# Patient Record
Sex: Female | Born: 1980 | Race: White | Hispanic: No | Marital: Married | Smoking: Never smoker
Health system: Southern US, Community
[De-identification: ages and names within clinical notes are randomized; demographics above are authoritative.]

## PROBLEM LIST (undated history)

## (undated) ENCOUNTER — Inpatient Hospital Stay (HOSPITAL_COMMUNITY): Payer: Self-pay

## (undated) ENCOUNTER — Inpatient Hospital Stay (HOSPITAL_COMMUNITY): Payer: BC Managed Care – PPO

## (undated) DIAGNOSIS — Z8619 Personal history of other infectious and parasitic diseases: Secondary | ICD-10-CM

## (undated) DIAGNOSIS — O139 Gestational [pregnancy-induced] hypertension without significant proteinuria, unspecified trimester: Secondary | ICD-10-CM

## (undated) DIAGNOSIS — N200 Calculus of kidney: Secondary | ICD-10-CM

## (undated) DIAGNOSIS — Z789 Other specified health status: Secondary | ICD-10-CM

## (undated) HISTORY — DX: Personal history of other infectious and parasitic diseases: Z86.19

## (undated) HISTORY — PX: DILATION AND CURETTAGE OF UTERUS: SHX78

## (undated) HISTORY — DX: Calculus of kidney: N20.0

## (undated) HISTORY — DX: Gestational (pregnancy-induced) hypertension without significant proteinuria, unspecified trimester: O13.9

---

## 2000-02-06 HISTORY — PX: WISDOM TOOTH EXTRACTION: SHX21

## 2000-10-17 ENCOUNTER — Other Ambulatory Visit: Admission: RE | Admit: 2000-10-17 | Discharge: 2000-10-17 | Payer: Self-pay | Admitting: Obstetrics & Gynecology

## 2002-01-27 ENCOUNTER — Other Ambulatory Visit: Admission: RE | Admit: 2002-01-27 | Discharge: 2002-01-27 | Payer: Self-pay | Admitting: Obstetrics and Gynecology

## 2011-01-19 ENCOUNTER — Encounter (HOSPITAL_COMMUNITY): Payer: Self-pay | Admitting: Pharmacist

## 2011-01-19 NOTE — H&P (Addendum)
  H&P Dicatated #[id number] 01/19/11 DL Pt seen and examined.  Questions answered.  Informed consent obtained. 01/22/11 1000 DL

## 2011-01-21 MED ORDER — DEXTROSE 5 % IV SOLN
1.0000 g | INTRAVENOUS | Status: AC
  Administered 2011-01-22: 1 g via INTRAVENOUS
  Filled 2011-01-21: qty 1

## 2011-01-22 ENCOUNTER — Encounter (HOSPITAL_COMMUNITY): Payer: Self-pay | Admitting: *Deleted

## 2011-01-22 ENCOUNTER — Encounter (HOSPITAL_COMMUNITY): Payer: Self-pay | Admitting: Anesthesiology

## 2011-01-22 ENCOUNTER — Ambulatory Visit (HOSPITAL_COMMUNITY): Payer: BC Managed Care – PPO | Admitting: Anesthesiology

## 2011-01-22 ENCOUNTER — Other Ambulatory Visit: Payer: Self-pay | Admitting: Obstetrics and Gynecology

## 2011-01-22 ENCOUNTER — Ambulatory Visit (HOSPITAL_COMMUNITY)
Admission: RE | Admit: 2011-01-22 | Discharge: 2011-01-22 | Disposition: A | Discharge status: Home / Self Care | Payer: BC Managed Care – PPO | Source: Ambulatory Visit | Attending: Obstetrics and Gynecology | Admitting: Obstetrics and Gynecology

## 2011-01-22 ENCOUNTER — Encounter (HOSPITAL_COMMUNITY)
Admission: RE | Disposition: A | Discharge status: Home / Self Care | Payer: Self-pay | Source: Ambulatory Visit | Attending: Obstetrics and Gynecology

## 2011-01-22 DIAGNOSIS — O021 Missed abortion: Secondary | ICD-10-CM | POA: Insufficient documentation

## 2011-01-22 HISTORY — PX: DILATION AND EVACUATION: SHX1459

## 2011-01-22 HISTORY — DX: Other specified health status: Z78.9

## 2011-01-22 LAB — CBC
HCT: 34.1 % — ABNORMAL LOW (ref 36.0–46.0)
Hemoglobin: 11.9 g/dL — ABNORMAL LOW (ref 12.0–15.0)
MCV: 92.7 fL (ref 78.0–100.0)
RDW: 12.7 % (ref 11.5–15.5)
WBC: 6.3 10*3/uL (ref 4.0–10.5)

## 2011-01-22 LAB — TSH: TSH: 1.991 u[IU]/mL (ref 0.350–4.500)

## 2011-01-22 LAB — T4, FREE: Free T4: 1.01 ng/dL (ref 0.80–1.80)

## 2011-01-22 LAB — ANTITHROMBIN III: AntiThromb III Func: 85 % (ref 75–120)

## 2011-01-22 SURGERY — DILATION AND EVACUATION, UTERUS
Anesthesia: Monitor Anesthesia Care | Site: Uterus | Wound class: Clean Contaminated

## 2011-01-22 MED ORDER — DEXAMETHASONE SODIUM PHOSPHATE 10 MG/ML IJ SOLN
INTRAMUSCULAR | Status: DC | PRN
  Administered 2011-01-22: 10 mg via INTRAVENOUS

## 2011-01-22 MED ORDER — RHO D IMMUNE GLOBULIN 1500 UNIT/2ML IJ SOLN
300.0000 ug | Freq: Once | INTRAMUSCULAR | Status: AC
  Administered 2011-01-22: 300 ug via INTRAMUSCULAR

## 2011-01-22 MED ORDER — MIDAZOLAM HCL 2 MG/2ML IJ SOLN
INTRAMUSCULAR | Status: AC
  Filled 2011-01-22: qty 2

## 2011-01-22 MED ORDER — METHYLERGONOVINE MALEATE 0.2 MG PO TABS: 0.2000 mg | ORAL_TABLET | Freq: Three times a day (TID) | ORAL | Status: AC

## 2011-01-22 MED ORDER — ONDANSETRON HCL 4 MG/2ML IJ SOLN
INTRAMUSCULAR | Status: DC | PRN
  Administered 2011-01-22: 4 mg via INTRAVENOUS

## 2011-01-22 MED ORDER — METHYLERGONOVINE MALEATE 0.2 MG/ML IJ SOLN
INTRAMUSCULAR | Status: DC | PRN
  Administered 2011-01-22: 0.2 mg via INTRAMUSCULAR

## 2011-01-22 MED ORDER — CEFAZOLIN SODIUM 1-5 GM-% IV SOLN
INTRAVENOUS | Status: AC
  Filled 2011-01-22: qty 50

## 2011-01-22 MED ORDER — KETOROLAC TROMETHAMINE 30 MG/ML IJ SOLN: 15.0000 mg | Freq: Once | INTRAMUSCULAR | Status: DC | PRN

## 2011-01-22 MED ORDER — LACTATED RINGERS IV SOLN
INTRAVENOUS | Status: DC
  Administered 2011-01-22 (×2): via INTRAVENOUS

## 2011-01-22 MED ORDER — METHYLERGONOVINE MALEATE 0.2 MG/ML IJ SOLN
INTRAMUSCULAR | Status: AC
  Filled 2011-01-22: qty 1

## 2011-01-22 MED ORDER — FENTANYL CITRATE 0.05 MG/ML IJ SOLN
INTRAMUSCULAR | Status: AC
  Filled 2011-01-22: qty 2

## 2011-01-22 MED ORDER — OXYCODONE-ACETAMINOPHEN 5-325 MG PO TABS
ORAL_TABLET | ORAL | Status: AC
  Administered 2011-01-22: 1 via ORAL
  Filled 2011-01-22: qty 1

## 2011-01-22 MED ORDER — PROPOFOL 10 MG/ML IV EMUL
INTRAVENOUS | Status: DC | PRN
  Administered 2011-01-22: 30 mg via INTRAVENOUS
  Administered 2011-01-22 (×2): 20 mg via INTRAVENOUS
  Administered 2011-01-22: 4 mg via INTRAVENOUS
  Administered 2011-01-22 (×2): 20 mg via INTRAVENOUS
  Administered 2011-01-22: 30 mg via INTRAVENOUS
  Administered 2011-01-22 (×2): 20 mg via INTRAVENOUS

## 2011-01-22 MED ORDER — FENTANYL CITRATE 0.05 MG/ML IJ SOLN: 25.0000 ug | INTRAMUSCULAR | Status: DC | PRN

## 2011-01-22 MED ORDER — LIDOCAINE HCL (CARDIAC) 20 MG/ML IV SOLN
INTRAVENOUS | Status: AC
  Filled 2011-01-22: qty 5

## 2011-01-22 MED ORDER — KETOROLAC TROMETHAMINE 30 MG/ML IJ SOLN
INTRAMUSCULAR | Status: AC
  Filled 2011-01-22: qty 1

## 2011-01-22 MED ORDER — KETOROLAC TROMETHAMINE 30 MG/ML IJ SOLN
INTRAMUSCULAR | Status: DC | PRN
  Administered 2011-01-22: 30 mg via INTRAVENOUS

## 2011-01-22 MED ORDER — DEXAMETHASONE SODIUM PHOSPHATE 10 MG/ML IJ SOLN
INTRAMUSCULAR | Status: AC
  Filled 2011-01-22: qty 1

## 2011-01-22 MED ORDER — PROPOFOL 10 MG/ML IV EMUL
INTRAVENOUS | Status: AC
  Filled 2011-01-22: qty 20

## 2011-01-22 MED ORDER — MIDAZOLAM HCL 5 MG/5ML IJ SOLN
INTRAMUSCULAR | Status: DC | PRN
  Administered 2011-01-22: 2 mg via INTRAVENOUS

## 2011-01-22 MED ORDER — LIDOCAINE HCL (CARDIAC) 20 MG/ML IV SOLN
INTRAVENOUS | Status: DC | PRN
  Administered 2011-01-22: 60 mg via INTRAVENOUS

## 2011-01-22 MED ORDER — LIDOCAINE-EPINEPHRINE 1 %-1:100000 IJ SOLN
INTRAMUSCULAR | Status: DC | PRN
  Administered 2011-01-22: 20 mL

## 2011-01-22 MED ORDER — FENTANYL CITRATE 0.05 MG/ML IJ SOLN
INTRAMUSCULAR | Status: DC | PRN
  Administered 2011-01-22: 100 ug via INTRAVENOUS

## 2011-01-22 MED ORDER — OXYCODONE-ACETAMINOPHEN 5-325 MG PO TABS
1.0000 | ORAL_TABLET | Freq: Once | ORAL | Status: AC
  Administered 2011-01-22: 1 via ORAL

## 2011-01-22 MED ORDER — ONDANSETRON HCL 4 MG/2ML IJ SOLN
INTRAMUSCULAR | Status: AC
Start: 2011-01-22 — End: 2011-01-22
  Filled 2011-01-22: qty 2

## 2011-01-22 SURGICAL SUPPLY — 18 items
CATH ROBINSON RED A/P 16FR (CATHETERS) ×2 IMPLANT
CLOTH BEACON ORANGE TIMEOUT ST (SAFETY) ×2 IMPLANT
DECANTER SPIKE VIAL GLASS SM (MISCELLANEOUS) ×2 IMPLANT
GLOVE BIO SURGEON STRL SZ8 (GLOVE) ×2 IMPLANT
GLOVE SURG ORTHO 8.0 STRL STRW (GLOVE) ×2 IMPLANT
GOWN PREVENTION PLUS LG XLONG (DISPOSABLE) ×2 IMPLANT
KIT BERKELEY 1ST TRIMESTER 3/8 (MISCELLANEOUS) ×2 IMPLANT
NEEDLE SPNL 22GX3.5 QUINCKE BK (NEEDLE) ×2 IMPLANT
NS IRRIG 1000ML POUR BTL (IV SOLUTION) ×2 IMPLANT
PACK VAGINAL MINOR WOMEN LF (CUSTOM PROCEDURE TRAY) ×2 IMPLANT
PAD PREP 24X48 CUFFED NSTRL (MISCELLANEOUS) ×2 IMPLANT
SET BERKELEY SUCTION TUBING (SUCTIONS) ×2 IMPLANT
SYR CONTROL 10ML LL (SYRINGE) ×2 IMPLANT
TOWEL OR 17X24 6PK STRL BLUE (TOWEL DISPOSABLE) ×4 IMPLANT
VACURETTE 10 RIGID CVD (CANNULA) IMPLANT
VACURETTE 7MM CVD STRL WRAP (CANNULA) IMPLANT
VACURETTE 8 RIGID CVD (CANNULA) IMPLANT
VACURETTE 9 RIGID CVD (CANNULA) IMPLANT

## 2011-01-22 NOTE — Transfer of Care (Signed)
Immediate Anesthesia Transfer of Care Note  Patient: Lynn Barnett  Procedure(s) Performed:  DILATATION AND EVACUATION - with genetic studies  Patient Location: PACU  Anesthesia Type: MAC  Level of Consciousness: sedated  Airway & Oxygen Therapy: Patient Spontanous Breathing and Patient connected to nasal cannula oxygen  Post-op Assessment: Report given to PACU RN  Post vital signs: Reviewed and stable  Complications: No apparent anesthesia complications

## 2011-01-22 NOTE — Anesthesia Preprocedure Evaluation (Signed)
Anesthesia Evaluation  Patient identified by MRN, date of birth, ID band Patient awake    Reviewed: Allergy & Precautions, H&P , NPO status , Patient's Chart, lab work & pertinent test results, reviewed documented beta blocker date and time   History of Anesthesia Complications Negative for: history of anesthetic complications  Airway Mallampati: II      Dental  (+) Teeth Intact   Pulmonary Recent URI  (currently on z-pack.  cough with clear sputum, no fever),  clear to auscultation  Pulmonary exam normal       Cardiovascular neg cardio ROS regular Normal    Neuro/Psych Negative Neurological ROS  Negative Psych ROS   GI/Hepatic negative GI ROS, Neg liver ROS,   Endo/Other  Negative Endocrine ROS  Renal/GU negative Renal ROS     Musculoskeletal   Abdominal   Peds  Hematology negative hematology ROS (+)   Anesthesia Other Findings   Reproductive/Obstetrics (+) Pregnancy (missed ab)                           Anesthesia Physical Anesthesia Plan  ASA: II  Anesthesia Plan: MAC   Post-op Pain Management:    Induction:   Airway Management Planned:   Additional Equipment:   Intra-op Plan:   Post-operative Plan:   Informed Consent: I have reviewed the patients History and Physical, chart, labs and discussed the procedure including the risks, benefits and alternatives for the proposed anesthesia with the patient or authorized representative who has indicated his/her understanding and acceptance.   Dental Advisory Given  Plan Discussed with: CRNA and Surgeon  Anesthesia Plan Comments:         Anesthesia Quick Evaluation

## 2011-01-22 NOTE — H&P (Signed)
NAMEBROOKLYNE, RADKE                ACCOUNT NO.:  [id number]  MEDICAL RECORD NO.:  [id number]  LOCATION:                                 FACILITY:  PHYSICIAN:  Dineen Kid. Rana Snare, M.D.    DATE OF BIRTH:  1980/09/29  DATE OF ADMISSION:  01/22/2011 DATE OF DISCHARGE:                             HISTORY & PHYSICAL   She is a 30 year old, G2, P0, at 10 weeks 6 days gestational age, who presented to my office yesterday for new OB evaluation.  Has a history of a miscarriage 6 months ago.  Unable to hear fetal heart tones.  She underwent ultrasound evaluation showing an embryonic demise approximately 10 week size.  She presents for dilation and evacuation. She is Rh negative.  PAST MEDICAL HISTORY:  Negative.  PAST SURGICAL HISTORY:  Also, was negative.  MEDICATIONS:  Prenatal vitamins.  ALLERGIES:  She has no known drug allergies.  PHYSICAL EXAMINATION:  VITAL SIGNS:  Her blood pressure is 102/62. HEART:  Regular rate and rhythm. LUNGS:  Clear to auscultation bilaterally. ABDOMEN:  Nondistended and nontender.  Uterus 10 week size, anteverted, mobile.  IMPRESSION AND PLAN:  Embryonic demise at 10 weeks 6 days gestational age.  This is her second miscarriage.  We are going to do genetic evaluation of the products of conception and other habitual aborter labs.  Risks and benefits of D and E were discussed at length.  Informed consent was obtained.     Dineen Kid Rana Snare, M.D.     DCL/MEDQ  D:  01/19/2011  T:  01/20/2011  Job:  [id number]

## 2011-01-22 NOTE — Op Note (Signed)
[id number] Dictation D&E for 10week Embryonic demise MAC/para cx EBL Min Spec:  POC for genetics No complications

## 2011-01-22 NOTE — Anesthesia Postprocedure Evaluation (Addendum)
Anesthesia Post Note  Patient: Lynn Barnett  Procedure(s) Performed:  DILATATION AND EVACUATION - with genetic studies  Anesthesia type: MAC  Patient location: PACU  Post pain: Pain level controlled  Post assessment: Post-op Vital signs reviewed  Last Vitals:  Filed Vitals:   01/22/11 1140  BP:   Pulse:   Temp: 36.3 C  Resp:     Post vital signs: Reviewed  Level of consciousness: sedated  Complications: No apparent anesthesia complications

## 2011-01-23 ENCOUNTER — Encounter (HOSPITAL_COMMUNITY): Payer: Self-pay | Admitting: Obstetrics and Gynecology

## 2011-01-23 LAB — RH IG WORKUP (INCLUDES ABO/RH): Unit division: 0

## 2011-01-23 LAB — LUPUS ANTICOAGULANT PANEL
Lupus Anticoagulant: NOT DETECTED
PTT Lupus Anticoagulant: 37.8 secs (ref 28.0–43.0)

## 2011-01-23 LAB — BETA-2-GLYCOPROTEIN I ABS, IGG/M/A
Beta-2 Glyco I IgG: 0 G Units (ref <20)
Beta-2-Glycoprotein I IgM: 2 M Units (ref <20)

## 2011-01-23 LAB — ANA: Anti Nuclear Antibody(ANA): NEGATIVE

## 2011-01-23 LAB — CARDIOLIPIN ANTIBODIES, IGG, IGM, IGA: Anticardiolipin IgA: 3 APL U/mL — ABNORMAL LOW (ref <22)

## 2011-01-23 LAB — PROTEIN C ACTIVITY: Protein C Activity: 200 % — ABNORMAL HIGH (ref 75–133)

## 2011-01-23 NOTE — Op Note (Signed)
NAMETENIYA, FILTER                ACCOUNT NO.:  [id number]  MEDICAL RECORD NO.:  [id number]  LOCATION:  [facility]                          FACILITY:  [facility]  PHYSICIAN:  Dineen Kid. Rana Snare, M.D.    DATE OF BIRTH:  08-29-1980  DATE OF PROCEDURE:  01/22/2011 DATE OF DISCHARGE:  01/22/2011                              OPERATIVE REPORT   PREOPERATIVE DIAGNOSIS:  Embryonic demise at [redacted] weeks gestational age.  POSTOPERATIVE DIAGNOSIS:  Embryonic demise at [redacted] weeks gestational age.  PROCEDURE:  Dilation and evacuation.  SURGEON:  Dineen Kid. Rana Snare, MD  ANESTHESIA:  Monitored anesthetic care and paracervical block.  INDICATIONS:  Ms. Happel is a 30 year old G2, now P2 with a 18-week embryonic demise confirmed by recent ultrasound.  She desires the dilation and evacuation.  Risks and benefits were discussed.  Informed consent was obtained.  DESCRIPTION OF PROCEDURE:  After adequate analgesia, the patient was placed in the dorsal lithotomy position.  She was sterilely prepped and draped.  The bladder was sterilely drained.  Graves speculum was placed. Tenaculum was placed in the anterior lip of the cervix.  Paracervical block was placed with 1% Xylocaine 1:100,000 epinephrine.  Total of 20 mL was used.  The uterus sounded 12 cm, easily dilated to #35 Pratt dilator.  A 10-mm suction curette was inserted.  Suction curettage was carried out retrieving products of conception.  Curettage was carried out until gritty surface was felt throughout the endometrial cavity. The patient was then given Methergine 0.2 mg with good uterine response and minimal tissue being retrieved.  The curette was then removed. Tenaculum was removed from the cervix, noted to be hemostatic.  The patient was then transferred to the recovery room in stable condition. Sponge count was normal x3.  Estimated blood loss was minimal.  The patient received 1 g of cefotetan preoperatively, 0.2 mg of Methergine IM intraoperatively, and 30 mg  of Toradol IV postoperatively.  DISPOSITION:  The patient will be discharged home and will follow up in the office in 2-3 weeks.  Sent her with routine instruction for D and E. Told to return for increased pain, fever, or bleeding, also prescription for Methergine 0.2 mg to take 3 times a day for 2 days.  She also received RhoGAM for Rh-negative status.  The tissue was sent for genetics.  Preoperatively ordered labs done as well.    Dineen Kid Rana Snare, M.D.    DCL/MEDQ  D:  01/22/2011  T:  01/23/2011  Job:  [id number]

## 2011-01-24 LAB — PROTEIN C, TOTAL: Protein C, Total: 169 % — ABNORMAL HIGH (ref 72–160)

## 2011-02-05 LAB — TISSUE HYBRIDIZATION TO NCBH

## 2011-02-05 LAB — CHROMOSOME ANALYSIS, PERIPHERAL BLOOD

## 2011-02-05 LAB — CHROMOSOME STD, POC(TISSUE)-NCBH

## 2011-10-04 LAB — OB RESULTS CONSOLE ANTIBODY SCREEN: Antibody Screen: NEGATIVE

## 2011-10-04 LAB — OB RESULTS CONSOLE ABO/RH

## 2011-10-04 LAB — OB RESULTS CONSOLE HEPATITIS B SURFACE ANTIGEN: Hepatitis B Surface Ag: NEGATIVE

## 2012-02-06 NOTE — L&D Delivery Note (Signed)
Delivery Note  Pt pushing x 1.5 hours with Vtx ROP manually rotated to ROA.  Pt reports being exhausted.  We discussed R&B  Discussed and informed consent for VE.  ROA at +3.  Mighty vac placed and one pull. SVD viable female Apgars 9,9 over intact perineum but right labia and periurethral laceration.  Placenta delivered spontaneously intact with 3VC. Repair with 2-0 Chromic with good support and hemostasis noted and R/V exam confirms.  PH art was sent.  [place] cord blood was done.  Mother and baby were doing well.  EBL 400cc  Candice Camp, MD

## 2012-04-11 ENCOUNTER — Institutional Professional Consult (permissible substitution): Payer: BC Managed Care – PPO | Admitting: Pediatrics

## 2012-04-15 ENCOUNTER — Ambulatory Visit (INDEPENDENT_AMBULATORY_CARE_PROVIDER_SITE_OTHER): Payer: BC Managed Care – PPO | Admitting: Pediatrics

## 2012-04-15 DIAGNOSIS — Z7681 Expectant parent(s) prebirth pediatrician visit: Secondary | ICD-10-CM

## 2012-04-15 NOTE — Progress Notes (Signed)
Subjective:     Patient ID: Lynn Barnett, female   DOB: August 28, 1980, 32 y.o.   MRN: [id number]  HPI Review of Systems Objective:   Physical Exam  Described practice hours, after hours contact information and clinic Answered parent questions Recommended reviewing "Period of Purple Crying" and "Happiest Baby on the Block"

## 2012-05-01 ENCOUNTER — Encounter (HOSPITAL_COMMUNITY): Payer: Self-pay | Admitting: *Deleted

## 2012-05-01 ENCOUNTER — Telehealth (HOSPITAL_COMMUNITY): Payer: Self-pay | Admitting: *Deleted

## 2012-05-01 NOTE — Telephone Encounter (Signed)
Preadmission screen  

## 2012-05-03 ENCOUNTER — Inpatient Hospital Stay (HOSPITAL_COMMUNITY)
Admission: AD | Admit: 2012-05-03 | Discharge: 2012-05-03 | Disposition: A | Discharge status: Home / Self Care | Payer: BC Managed Care – PPO | Source: Ambulatory Visit | Attending: Obstetrics and Gynecology | Admitting: Obstetrics and Gynecology

## 2012-05-03 ENCOUNTER — Encounter (HOSPITAL_COMMUNITY): Payer: Self-pay | Admitting: Family

## 2012-05-03 DIAGNOSIS — O99891 Other specified diseases and conditions complicating pregnancy: Secondary | ICD-10-CM | POA: Insufficient documentation

## 2012-05-03 NOTE — MAU Note (Signed)
Pt reports she had some uncontrolled leaking his morning while she was in bed. Not leaking any more now. Reports good fetal movment denies bleeding and having mild occasional ctx.

## 2012-05-03 NOTE — MAU Note (Signed)
Patient presents to MAU with c/o leaking clear fluid with mucous today at 0830. Denies vaginal bleeding, cramping. Reports good fetal movement.  Patient reports that she was 2-2.5 cm in office on Wednesday.

## 2012-05-04 ENCOUNTER — Encounter (HOSPITAL_COMMUNITY): Payer: Self-pay | Admitting: Obstetrics and Gynecology

## 2012-05-04 ENCOUNTER — Inpatient Hospital Stay (HOSPITAL_COMMUNITY)
Admission: AD | Admit: 2012-05-04 | Discharge: 2012-05-04 | Disposition: A | Discharge status: Home / Self Care | Payer: BC Managed Care – PPO | Source: Ambulatory Visit | Attending: Obstetrics and Gynecology | Admitting: Obstetrics and Gynecology

## 2012-05-04 DIAGNOSIS — O479 False labor, unspecified: Secondary | ICD-10-CM | POA: Insufficient documentation

## 2012-05-04 NOTE — MAU Note (Signed)
Patient states she is having contractions every 3-6 minutes with bloody show. States she has not felt fetal movement this am. Was seen in MAU about 24 hours ago and was 3 cm.

## 2012-05-04 NOTE — Progress Notes (Signed)
Notified Dr. Marcelle Overlie of cervical exam and patients appt. To have induction tomorrow at 0615. Pt notified to come back if things become stronger.

## 2012-05-05 ENCOUNTER — Encounter (HOSPITAL_COMMUNITY): Payer: Self-pay | Admitting: Anesthesiology

## 2012-05-05 ENCOUNTER — Inpatient Hospital Stay (HOSPITAL_COMMUNITY): Payer: BC Managed Care – PPO | Admitting: Anesthesiology

## 2012-05-05 ENCOUNTER — Encounter (HOSPITAL_COMMUNITY): Payer: Self-pay

## 2012-05-05 ENCOUNTER — Inpatient Hospital Stay (HOSPITAL_COMMUNITY)
Admission: RE | Admit: 2012-05-05 | Discharge: 2012-05-07 | DRG: 373 | Disposition: A | Discharge status: Home / Self Care | Payer: BC Managed Care – PPO | Source: Ambulatory Visit | Attending: Obstetrics and Gynecology | Admitting: Obstetrics and Gynecology

## 2012-05-05 LAB — CBC
Hemoglobin: 11.6 g/dL — ABNORMAL LOW (ref 12.0–15.0)
MCHC: 33.7 g/dL (ref 30.0–36.0)
Platelets: 174 10*3/uL (ref 150–400)
RDW: 14.3 % (ref 11.5–15.5)

## 2012-05-05 LAB — RPR: RPR Ser Ql: NONREACTIVE

## 2012-05-05 MED ORDER — OXYCODONE-ACETAMINOPHEN 5-325 MG PO TABS: 1.0000 | ORAL_TABLET | ORAL | Status: DC | PRN

## 2012-05-05 MED ORDER — EPHEDRINE 5 MG/ML INJ
10.0000 mg | INTRAVENOUS | Status: DC | PRN
  Filled 2012-05-05: qty 4
  Filled 2012-05-05: qty 2

## 2012-05-05 MED ORDER — LIDOCAINE HCL (PF) 1 % IJ SOLN
INTRAMUSCULAR | Status: DC | PRN
  Administered 2012-05-05 (×2): 5 mL

## 2012-05-05 MED ORDER — TERBUTALINE SULFATE 1 MG/ML IJ SOLN: 0.2500 mg | Freq: Once | INTRAMUSCULAR | Status: AC | PRN

## 2012-05-05 MED ORDER — PHENYLEPHRINE 40 MCG/ML (10ML) SYRINGE FOR IV PUSH (FOR BLOOD PRESSURE SUPPORT)
80.0000 ug | PREFILLED_SYRINGE | INTRAVENOUS | Status: DC | PRN
  Filled 2012-05-05: qty 2

## 2012-05-05 MED ORDER — PHENYLEPHRINE 40 MCG/ML (10ML) SYRINGE FOR IV PUSH (FOR BLOOD PRESSURE SUPPORT)
80.0000 ug | PREFILLED_SYRINGE | INTRAVENOUS | Status: DC | PRN
  Filled 2012-05-05: qty 2
  Filled 2012-05-05: qty 5

## 2012-05-05 MED ORDER — EPHEDRINE 5 MG/ML INJ
10.0000 mg | INTRAVENOUS | Status: DC | PRN
  Filled 2012-05-05: qty 2

## 2012-05-05 MED ORDER — OXYTOCIN 40 UNITS IN LACTATED RINGERS INFUSION - SIMPLE MED
62.5000 mL/h | INTRAVENOUS | Status: DC
  Administered 2012-05-05: 2 m[IU]/min via INTRAVENOUS
  Administered 2012-05-05: 4 m[IU]/min via INTRAVENOUS
  Filled 2012-05-05: qty 1000

## 2012-05-05 MED ORDER — OXYTOCIN BOLUS FROM INFUSION
500.0000 mL | INTRAVENOUS | Status: DC
  Administered 2012-05-05: 500 mL via INTRAVENOUS

## 2012-05-05 MED ORDER — OXYTOCIN 40 UNITS IN LACTATED RINGERS INFUSION - SIMPLE MED: 62.5000 mL/h | INTRAVENOUS | Status: DC

## 2012-05-05 MED ORDER — DIPHENHYDRAMINE HCL 50 MG/ML IJ SOLN: 12.5000 mg | INTRAMUSCULAR | Status: DC | PRN

## 2012-05-05 MED ORDER — LACTATED RINGERS IV SOLN: 500.0000 mL | Freq: Once | INTRAVENOUS | Status: DC

## 2012-05-05 MED ORDER — PRENATAL MULTIVITAMIN CH: 1.0000 | ORAL_TABLET | Freq: Every day | ORAL | Status: DC

## 2012-05-05 MED ORDER — FLEET ENEMA 7-19 GM/118ML RE ENEM: 1.0000 | ENEMA | Freq: Every day | RECTAL | Status: DC | PRN

## 2012-05-05 MED ORDER — LIDOCAINE HCL (PF) 1 % IJ SOLN
30.0000 mL | INTRAMUSCULAR | Status: DC | PRN
  Administered 2012-05-05: 30 mL via SUBCUTANEOUS
  Filled 2012-05-05 (×2): qty 30

## 2012-05-05 MED ORDER — LACTATED RINGERS IV SOLN
INTRAVENOUS | Status: DC
  Administered 2012-05-05: 21:00:00 via INTRAVENOUS
  Administered 2012-05-05 (×2): 1000 mL via INTRAVENOUS

## 2012-05-05 MED ORDER — IBUPROFEN 600 MG PO TABS: 600.0000 mg | ORAL_TABLET | Freq: Four times a day (QID) | ORAL | Status: DC | PRN

## 2012-05-05 MED ORDER — CITRIC ACID-SODIUM CITRATE 334-500 MG/5ML PO SOLN: 30.0000 mL | ORAL | Status: DC | PRN

## 2012-05-05 MED ORDER — ACETAMINOPHEN 325 MG PO TABS: 650.0000 mg | ORAL_TABLET | ORAL | Status: DC | PRN

## 2012-05-05 MED ORDER — FENTANYL 2.5 MCG/ML BUPIVACAINE 1/10 % EPIDURAL INFUSION (WH - ANES)
14.0000 mL/h | INTRAMUSCULAR | Status: DC | PRN
  Administered 2012-05-05 (×2): 14 mL/h via EPIDURAL
  Filled 2012-05-05 (×2): qty 125

## 2012-05-05 MED ORDER — ONDANSETRON HCL 4 MG/2ML IJ SOLN: 4.0000 mg | Freq: Four times a day (QID) | INTRAMUSCULAR | Status: DC | PRN

## 2012-05-05 MED ORDER — LACTATED RINGERS IV SOLN
500.0000 mL | INTRAVENOUS | Status: DC | PRN
  Administered 2012-05-05: 1000 mL via INTRAVENOUS
  Administered 2012-05-05: 500 mL via INTRAVENOUS

## 2012-05-05 NOTE — Anesthesia Procedure Notes (Signed)
Epidural Patient location during procedure: OB Start time: 05/05/2012 11:13 AM  Staffing Anesthesiologist: Angus Seller., Harrell Gave. Performed by: anesthesiologist   Preanesthetic Checklist Completed: patient identified, site marked, surgical consent, pre-op evaluation, timeout performed, IV checked, risks and benefits discussed and monitors and equipment checked  Epidural Patient position: sitting Prep: site prepped and draped and DuraPrep Patient monitoring: continuous pulse ox and blood pressure Approach: midline Injection technique: LOR air and LOR saline  Needle:  Needle type: Tuohy  Needle gauge: 17 G Needle length: 9 cm and 9 Needle insertion depth: 5 cm cm Catheter type: closed end flexible Catheter size: 19 Gauge Catheter at skin depth: 10 cm Test dose: negative  Assessment Events: blood not aspirated, injection not painful, no injection resistance, negative IV test and no paresthesia  Additional Notes Patient identified.  Risk benefits discussed including failed block, incomplete pain control, headache, nerve damage, paralysis, blood pressure changes, nausea, vomiting, reactions to medication both toxic or allergic, and postpartum back pain.  Patient expressed understanding and wished to proceed.  All questions were answered.  Sterile technique used throughout procedure and epidural site dressed with sterile barrier dressing. No paresthesia or other complications noted.The patient did not experience any signs of intravascular injection such as tinnitus or metallic taste in mouth nor signs of intrathecal spread such as rapid motor block. Please see nursing notes for vital signs.

## 2012-05-05 NOTE — Progress Notes (Signed)
Dr Rana Snare updated on SVE, FHR, UC pattern, and nursing interventions.  No new orders given.

## 2012-05-05 NOTE — Anesthesia Preprocedure Evaluation (Signed)

## 2012-05-05 NOTE — Progress Notes (Signed)
Dr Rana Snare updated on SVE, FHR, and UC pattern.  Orders given to start pushing and call for delivery.

## 2012-05-05 NOTE — H&P (Signed)
Lynn Barnett is a 32 y.o. female presenting for IOL due to favorable cervix and pts desire for induction.  Also had presented this weekend for labor check and has had prolonged latent labor sxs.  Pregnancy uncomplicated.Marland Kitchen History OB History   Grav Para Term Preterm Abortions TAB SAB Ect Mult Living   3 0   2  2        Past Medical History  Diagnosis Date  . No pertinent past medical history   . Hx of varicella    Past Surgical History  Procedure Laterality Date  . Wisdom tooth extraction  2002  . Dilation and evacuation  01/22/2011    Procedure: DILATATION AND EVACUATION;  Surgeon: Turner Daniels, MD;  Location: [facility];  Service: Gynecology;  Laterality: N/A;  with genetic studies  . Dilation and curettage of uterus     Family History: family history includes Diabetes in her cousin; Heart attack in her maternal grandfather; Hypertension in her father and paternal grandmother; Mental illness in her cousin; and Thyroid disease in her cousin. Social History:  reports that she has never smoked. She has never used smokeless tobacco. She reports that she drinks about 0.6 ounces of alcohol per week. She reports that she does not use illicit drugs.   Prenatal Transfer Tool  Maternal Diabetes: No Genetic Screening: Normal Maternal Ultrasounds/Referrals: Normal Fetal Ultrasounds or other Referrals:  None Maternal Substance Abuse:  No Significant Maternal Medications:  None Significant Maternal Lab Results:  None Other Comments:  None  ROS  Dilation: 4 Effacement (%): 90 Station: -2 Exam by:: Dr Rana Snare AROM clear fluid Blood pressure 121/72, pulse 79, temperature 98.2 F (36.8 C), temperature source Oral, resp. rate 18, last menstrual period 07/24/2011. Exam Physical Exam  Prenatal labs: ABO, Rh: B/Negative/-- (08/29 0000) Antibody: Negative (08/29 0000) Rubella: Immune (08/29 0000) RPR: Nonreactive (08/29 0000)  HBsAg: Negative (08/29 0000)  HIV: Non-reactive (08/29 0000)  GBS:  Negative (03/03 0000)   Assessment/Plan: IUP at term with favorable cervix and desires induction AROM and anticipate SVD   Ronnesha Mester C 05/05/2012, 9:05 AM

## 2012-05-06 ENCOUNTER — Encounter (HOSPITAL_COMMUNITY): Payer: Self-pay

## 2012-05-06 LAB — CBC
Hemoglobin: 9.6 g/dL — ABNORMAL LOW (ref 12.0–15.0)
MCHC: 34.5 g/dL (ref 30.0–36.0)
Platelets: 135 10*3/uL — ABNORMAL LOW (ref 150–400)
RBC: 2.97 MIL/uL — ABNORMAL LOW (ref 3.87–5.11)

## 2012-05-06 MED ORDER — PRENATAL MULTIVITAMIN CH
1.0000 | ORAL_TABLET | Freq: Every day | ORAL | Status: DC
  Administered 2012-05-06 – 2012-05-07 (×2): 1 via ORAL
  Filled 2012-05-06 (×2): qty 1

## 2012-05-06 MED ORDER — OXYCODONE-ACETAMINOPHEN 5-325 MG PO TABS: 1.0000 | ORAL_TABLET | ORAL | Status: DC | PRN

## 2012-05-06 MED ORDER — MEASLES, MUMPS & RUBELLA VAC ~~LOC~~ INJ
0.5000 mL | INJECTION | Freq: Once | SUBCUTANEOUS | Status: DC
  Filled 2012-05-06: qty 0.5

## 2012-05-06 MED ORDER — SENNOSIDES-DOCUSATE SODIUM 8.6-50 MG PO TABS
2.0000 | ORAL_TABLET | Freq: Every day | ORAL | Status: DC
  Administered 2012-05-06: 2 via ORAL

## 2012-05-06 MED ORDER — MEDROXYPROGESTERONE ACETATE 150 MG/ML IM SUSP: 150.0000 mg | INTRAMUSCULAR | Status: DC | PRN

## 2012-05-06 MED ORDER — DIBUCAINE 1 % RE OINT: 1.0000 "application " | TOPICAL_OINTMENT | RECTAL | Status: DC | PRN

## 2012-05-06 MED ORDER — IBUPROFEN 600 MG PO TABS
600.0000 mg | ORAL_TABLET | Freq: Four times a day (QID) | ORAL | Status: DC
  Administered 2012-05-06 – 2012-05-07 (×6): 600 mg via ORAL
  Filled 2012-05-06 (×6): qty 1

## 2012-05-06 MED ORDER — LANOLIN HYDROUS EX OINT: TOPICAL_OINTMENT | CUTANEOUS | Status: DC | PRN

## 2012-05-06 MED ORDER — DIPHENHYDRAMINE HCL 25 MG PO CAPS: 25.0000 mg | ORAL_CAPSULE | Freq: Four times a day (QID) | ORAL | Status: DC | PRN

## 2012-05-06 MED ORDER — WITCH HAZEL-GLYCERIN EX PADS: 1.0000 "application " | MEDICATED_PAD | CUTANEOUS | Status: DC | PRN

## 2012-05-06 MED ORDER — SIMETHICONE 80 MG PO CHEW: 80.0000 mg | CHEWABLE_TABLET | ORAL | Status: DC | PRN

## 2012-05-06 MED ORDER — ONDANSETRON HCL 4 MG PO TABS: 4.0000 mg | ORAL_TABLET | ORAL | Status: DC | PRN

## 2012-05-06 MED ORDER — ZOLPIDEM TARTRATE 5 MG PO TABS: 5.0000 mg | ORAL_TABLET | Freq: Every evening | ORAL | Status: DC | PRN

## 2012-05-06 MED ORDER — TETANUS-DIPHTH-ACELL PERTUSSIS 5-2.5-18.5 LF-MCG/0.5 IM SUSP: 0.5000 mL | Freq: Once | INTRAMUSCULAR | Status: DC

## 2012-05-06 MED ORDER — ONDANSETRON HCL 4 MG/2ML IJ SOLN: 4.0000 mg | INTRAMUSCULAR | Status: DC | PRN

## 2012-05-06 MED ORDER — BENZOCAINE-MENTHOL 20-0.5 % EX AERO
1.0000 "application " | INHALATION_SPRAY | CUTANEOUS | Status: DC | PRN
  Administered 2012-05-06 – 2012-05-07 (×2): 1 via TOPICAL
  Filled 2012-05-06 (×2): qty 56

## 2012-05-06 NOTE — Progress Notes (Signed)
Dr Rana Snare explained to pt risks and benefits of using vacuum for delivery.  Pt verbalized understanding and gave verbal consent for procedure.

## 2012-05-06 NOTE — Progress Notes (Signed)
Post Partum Day 1 Subjective: no complaints, up ad lib, voiding and tolerating PO  Objective: Blood pressure 106/65, pulse 72, temperature 97.9 F (36.6 C), temperature source Oral, resp. rate 18, height 5\' 4"  (1.626 m), weight 164 lb (74.39 kg), last menstrual period 07/24/2011, SpO2 94.00%, unknown if currently breastfeeding.  Physical Exam:  General: alert and cooperative Lochia: appropriate Uterine Fundus: firm Incision: perineum intact, small labial edema noted DVT Evaluation: No evidence of DVT seen on physical exam. Negative Homan's sign. No cords or calf tenderness. No significant calf/ankle edema.   Recent Labs  05/05/12 0707 05/06/12 0630  HGB 11.6* 9.6*  HCT 34.4* 27.8*    Assessment/Plan: Plan for discharge tomorrow and Circumcision prior to discharge   LOS: 1 day   Lynn Barnett G 05/06/2012, 7:53 AM

## 2012-05-06 NOTE — Anesthesia Postprocedure Evaluation (Signed)
  Anesthesia Post-op Note  Patient: Lynn Barnett  Procedure(s) Performed: * No procedures listed *  Patient Location: Mother/Baby  Anesthesia Type:Epidural  Level of Consciousness: awake, alert , oriented and patient cooperative  Airway and Oxygen Therapy: Patient Spontanous Breathing  Post-op Pain: mild  Post-op Assessment: Patient's Cardiovascular Status Stable, Respiratory Function Stable, Patent Airway, No signs of Nausea or vomiting, Adequate PO intake and Pain level controlled  Post-op Vital Signs: Reviewed and stable  Complications: No apparent anesthesia complications

## 2012-05-07 LAB — RH IG WORKUP (INCLUDES ABO/RH)
ABO/RH(D): B NEG
Gestational Age(Wks): 39.6

## 2012-05-07 MED ORDER — IBUPROFEN 600 MG PO TABS: 600.0000 mg | ORAL_TABLET | Freq: Four times a day (QID) | ORAL | Status: DC

## 2012-05-07 NOTE — Discharge Summary (Signed)
Obstetric Discharge Summary Reason for Admission: induction of labor Prenatal Procedures: ultrasound Intrapartum Procedures: vacuum Postpartum Procedures: none Complications-Operative and Postpartum: 2 degree perineal laceration Hemoglobin  Date Value Range Status  05/06/2012 9.6* 12.0 - 15.0 g/dL Final     DELTA CHECK NOTED     REPEATED TO VERIFY     HCT  Date Value Range Status  05/06/2012 27.8* 36.0 - 46.0 % Final    Physical Exam:  General: alert and cooperative Lochia: appropriate Uterine Fundus: firm Incision: perineum intact DVT Evaluation: No evidence of DVT seen on physical exam. Negative Homan's sign. No cords or calf tenderness. No significant calf/ankle edema.  Discharge Diagnoses: Term Pregnancy-delivered  Discharge Information: Date: 05/07/2012 Activity: pelvic rest Diet: routine Medications: PNV and Ibuprofen Condition: stable Instructions: refer to practice specific booklet Discharge to: home   Newborn Data: Live born female  Birth Weight: 8 lb 3.9 oz (3740 g) APGAR: 9, 9  Home with mother.  Abygail Galeno G 05/07/2012, 8:27 AM

## 2013-12-07 ENCOUNTER — Encounter (HOSPITAL_COMMUNITY): Payer: Self-pay

## 2015-07-18 LAB — OB RESULTS CONSOLE RPR: RPR: NONREACTIVE

## 2015-07-18 LAB — OB RESULTS CONSOLE HIV ANTIBODY (ROUTINE TESTING): HIV: NONREACTIVE

## 2015-07-18 LAB — OB RESULTS CONSOLE HEPATITIS B SURFACE ANTIGEN: Hepatitis B Surface Ag: NEGATIVE

## 2015-07-18 LAB — OB RESULTS CONSOLE RUBELLA ANTIBODY, IGM: Rubella: IMMUNE

## 2015-08-02 LAB — OB RESULTS CONSOLE GC/CHLAMYDIA
CHLAMYDIA, DNA PROBE: NEGATIVE
Gonorrhea: NEGATIVE

## 2016-01-23 LAB — OB RESULTS CONSOLE GBS: STREP GROUP B AG: NEGATIVE

## 2016-02-06 NOTE — L&D Delivery Note (Signed)
Operative Delivery Note At 6:09 AM a viable female was delivered via Vaginal, Vacuum Investment banker, operational(Extractor).  Presentation: vertex; Position: Right,, Occiput,, Transverse; Station: +3.  Verbal consent: obtained from patient.  Risks and benefits discussed in detail.  Risks include, but are not limited to the risks of anesthesia, bleeding, infection, damage to maternal tissues, fetal cephalhematoma.  There is also the risk of inability to effect vaginal delivery of the head, or shoulder dystocia that cannot be resolved by established maneuvers, leading to the need for emergency cesarean section.  APGAR: 7, 9; weight  .   Placenta status: , .   Cord:  with the following complications: nuchal cord x 1.  Cord pH: not obtained  Anesthesia:  epidural Instruments: mushroom Episiotomy: None Lacerations: Labial Suture Repair: 3.0 chromic Est. Blood Loss (mL): 300  Mom to postpartum.  Baby to Couplet care / Skin to Skin.  Lynn Barnett L 02/13/2016, 6:23 AM

## 2016-02-12 ENCOUNTER — Inpatient Hospital Stay (HOSPITAL_COMMUNITY)
Admission: AD | Admit: 2016-02-12 | Discharge: 2016-02-14 | DRG: 775 | Disposition: A | Discharge status: Home / Self Care | Payer: BC Managed Care – PPO | Source: Ambulatory Visit | Attending: Obstetrics and Gynecology | Admitting: Obstetrics and Gynecology

## 2016-02-12 ENCOUNTER — Encounter (HOSPITAL_COMMUNITY): Payer: Self-pay

## 2016-02-12 DIAGNOSIS — Z3A39 39 weeks gestation of pregnancy: Secondary | ICD-10-CM | POA: Diagnosis not present

## 2016-02-12 DIAGNOSIS — Z3493 Encounter for supervision of normal pregnancy, unspecified, third trimester: Secondary | ICD-10-CM | POA: Diagnosis present

## 2016-02-12 DIAGNOSIS — O4292 Full-term premature rupture of membranes, unspecified as to length of time between rupture and onset of labor: Secondary | ICD-10-CM

## 2016-02-12 DIAGNOSIS — Z8249 Family history of ischemic heart disease and other diseases of the circulatory system: Secondary | ICD-10-CM

## 2016-02-12 DIAGNOSIS — Z833 Family history of diabetes mellitus: Secondary | ICD-10-CM

## 2016-02-12 LAB — CBC
HEMATOCRIT: 33.4 % — AB (ref 36.0–46.0)
HEMOGLOBIN: 11.3 g/dL — AB (ref 12.0–15.0)
MCH: 31.5 pg (ref 26.0–34.0)
MCHC: 33.8 g/dL (ref 30.0–36.0)
MCV: 93 fL (ref 78.0–100.0)
Platelets: 161 10*3/uL (ref 150–400)
RBC: 3.59 MIL/uL — AB (ref 3.87–5.11)
RDW: 15.5 % (ref 11.5–15.5)
WBC: 6.1 10*3/uL (ref 4.0–10.5)

## 2016-02-12 LAB — POCT FERN TEST: POCT Fern Test: NEGATIVE

## 2016-02-12 MED ORDER — ACETAMINOPHEN 325 MG PO TABS: 650.0000 mg | ORAL_TABLET | ORAL | Status: DC | PRN

## 2016-02-12 MED ORDER — ONDANSETRON HCL 4 MG/2ML IJ SOLN: 4.0000 mg | Freq: Four times a day (QID) | INTRAMUSCULAR | Status: DC | PRN

## 2016-02-12 MED ORDER — OXYTOCIN BOLUS FROM INFUSION
500.0000 mL | Freq: Once | INTRAVENOUS | Status: AC
  Administered 2016-02-13: 500 mL via INTRAVENOUS

## 2016-02-12 MED ORDER — LACTATED RINGERS IV SOLN
INTRAVENOUS | Status: DC
  Administered 2016-02-12 – 2016-02-13 (×2): via INTRAVENOUS

## 2016-02-12 MED ORDER — SOD CITRATE-CITRIC ACID 500-334 MG/5ML PO SOLN: 30.0000 mL | ORAL | Status: DC | PRN

## 2016-02-12 MED ORDER — LIDOCAINE HCL (PF) 1 % IJ SOLN
30.0000 mL | INTRAMUSCULAR | Status: DC | PRN
Start: 2016-02-12 — End: 2016-02-13
  Filled 2016-02-12 (×2): qty 30

## 2016-02-12 MED ORDER — FLEET ENEMA 7-19 GM/118ML RE ENEM: 1.0000 | ENEMA | RECTAL | Status: DC | PRN

## 2016-02-12 MED ORDER — OXYCODONE-ACETAMINOPHEN 5-325 MG PO TABS: 1.0000 | ORAL_TABLET | ORAL | Status: DC | PRN

## 2016-02-12 MED ORDER — OXYTOCIN 40 UNITS IN LACTATED RINGERS INFUSION - SIMPLE MED
2.5000 [IU]/h | INTRAVENOUS | Status: DC
  Administered 2016-02-13: 2.5 [IU]/h via INTRAVENOUS
  Filled 2016-02-12: qty 1000

## 2016-02-12 MED ORDER — OXYCODONE-ACETAMINOPHEN 5-325 MG PO TABS: 2.0000 | ORAL_TABLET | ORAL | Status: DC | PRN

## 2016-02-12 MED ORDER — LACTATED RINGERS IV SOLN: 500.0000 mL | INTRAVENOUS | Status: DC | PRN

## 2016-02-12 NOTE — MAU Note (Signed)
Pt reports ? LOF at 9pm-clear. Some contractions every 5 mins. Denies vag bleeding. +FM.

## 2016-02-12 NOTE — MAU Provider Note (Signed)
History   [id number]   Chief Complaint  Patient presents with  . Rupture of Membranes    HPI Lynn Barnett is a 36 y.o. female  [phone] here with report of watery vaginal discharge that began at approximately 2100.  Leaking of fluid has continued.  Pt reports contractions and denies vaginal bleeding.   +fetal movement.   All other systems negative.    No LMP recorded. Patient is pregnant.  OB History  Gravida Para Term Preterm AB Living  4 1 1   2 1   SAB TAB Ectopic Multiple Live Births  2       1    # Outcome Date GA Lbr Len/2nd Weight Sex Delivery Anes PTL Lv  4 Current           3 Term 05/05/12 [redacted]w[redacted]d 44:36 / 02:07 8 lb 3.9 oz (3.74 kg) M Vag-Vacuum EPI  LIV     Birth Comments: CAH--Normal GAL--Normal Thyroid-Normal Biotinidase- Normal Hemoglobin--Normal, FA Amino Acid Profile-Normal Acylcarnitine Profile-Normal   2 SAB 2012          1 SAB 2012              Past Medical History:  Diagnosis Date  . Hx of varicella   . No pertinent past medical history     Family History  Problem Relation Age of Onset  . Hypertension Father   . Heart attack Maternal Grandfather   . Hypertension Paternal Grandmother   . Diabetes Cousin   . Mental illness Cousin     bipolar  . Thyroid disease Cousin     Social History   Social History  . Marital status: Married    Spouse name: N/A  . Number of children: N/A  . Years of education: N/A   Social History Main Topics  . Smoking status: Never Smoker  . Smokeless tobacco: Never Used  . Alcohol use 0.6 oz/week    1 Glasses of wine per week  . Drug use: No  . Sexual activity: Yes   Other Topics Concern  . Not on file   Social History Narrative  . No narrative on file    No Known Allergies  No current facility-administered medications on file prior to encounter.    Current Outpatient Prescriptions on File Prior to Encounter  Medication Sig Dispense Refill  . ibuprofen (ADVIL,MOTRIN) 600 MG tablet Take 1 tablet  (600 mg total) by mouth every 6 (six) hours. 30 tablet 1  . Prenatal Vit-Fe Fumarate-FA (PRENATAL MULTIVITAMIN) TABS Take 1 tablet by mouth daily at 12 noon.       Review of Systems  Genitourinary: Positive for pelvic pain (contractions) and vaginal discharge.     Physical Exam   Vitals:   02/12/16 2205  Pulse: 79  Resp: 16  Temp: 98.1 F (36.7 C)  TempSrc: Oral  SpO2: 99%    Physical Exam  Constitutional: She is oriented to person, place, and time. She appears well-developed and well-nourished. No distress.  HENT:  Head: Normocephalic.  Neck: Normal range of motion. Neck supple.  Cardiovascular: Normal rate, regular rhythm and normal heart sounds.   Respiratory: Effort normal and breath sounds normal.  GI: Soft. There is no tenderness.  Genitourinary: No bleeding in the vagina. Vaginal discharge found.  Genitourinary Comments: +pooling  Neurological: She is alert and oriented to person, place, and time.  Skin: Skin is warm and dry.  FHR 140, +accels Toco - 2-5   Dilation: 4.5 Effacement (%): 80  Cervical Position: Middle Exam by:: Margarita MailW. Karim, CNM   MAU Course  Procedures  MDM Results for orders placed or performed during the hospital encounter of 02/12/16 (from the past 24 hour(s))  Fern Test     Status: None   Collection Time: 02/12/16 10:50 PM  Result Value Ref Range   POCT Fern Test Negative = intact amniotic membranes    Amnisure pending  Assessment and Plan  35 y.o. [id number] at 2851w1d IUP R/O Rupture of Membranes Reactive NST  Plan: Amnisure pending Care resumed by Dr. Catha GosselinGrewal  Tc [place] N Karim, CNM 02/12/2016 10:49 PM

## 2016-02-13 ENCOUNTER — Inpatient Hospital Stay (HOSPITAL_COMMUNITY): Payer: BC Managed Care – PPO | Admitting: Anesthesiology

## 2016-02-13 ENCOUNTER — Encounter (HOSPITAL_COMMUNITY): Payer: Self-pay | Admitting: *Deleted

## 2016-02-13 LAB — RPR: RPR: NONREACTIVE

## 2016-02-13 MED ORDER — SIMETHICONE 80 MG PO CHEW: 80.0000 mg | CHEWABLE_TABLET | ORAL | Status: DC | PRN

## 2016-02-13 MED ORDER — BISACODYL 10 MG RE SUPP: 10.0000 mg | Freq: Every day | RECTAL | Status: DC | PRN

## 2016-02-13 MED ORDER — MEASLES, MUMPS & RUBELLA VAC ~~LOC~~ INJ
0.5000 mL | INJECTION | Freq: Once | SUBCUTANEOUS | Status: DC
  Filled 2016-02-13: qty 0.5

## 2016-02-13 MED ORDER — BENZOCAINE-MENTHOL 20-0.5 % EX AERO
1.0000 "application " | INHALATION_SPRAY | CUTANEOUS | Status: DC | PRN
  Filled 2016-02-13: qty 56

## 2016-02-13 MED ORDER — FLEET ENEMA 7-19 GM/118ML RE ENEM: 1.0000 | ENEMA | Freq: Every day | RECTAL | Status: DC | PRN

## 2016-02-13 MED ORDER — PHENYLEPHRINE 40 MCG/ML (10ML) SYRINGE FOR IV PUSH (FOR BLOOD PRESSURE SUPPORT)
PREFILLED_SYRINGE | INTRAVENOUS | Status: AC
  Filled 2016-02-13: qty 20

## 2016-02-13 MED ORDER — LIDOCAINE HCL (PF) 1 % IJ SOLN
INTRAMUSCULAR | Status: DC | PRN
  Administered 2016-02-13: 6 mL via EPIDURAL
  Administered 2016-02-13: 4 mL

## 2016-02-13 MED ORDER — COCONUT OIL OIL: 1.0000 "application " | TOPICAL_OIL | Status: DC | PRN

## 2016-02-13 MED ORDER — IBUPROFEN 600 MG PO TABS
600.0000 mg | ORAL_TABLET | Freq: Four times a day (QID) | ORAL | Status: DC
  Administered 2016-02-13 – 2016-02-14 (×4): 600 mg via ORAL
  Filled 2016-02-13 (×5): qty 1

## 2016-02-13 MED ORDER — EPHEDRINE 5 MG/ML INJ
10.0000 mg | INTRAVENOUS | Status: DC | PRN
  Filled 2016-02-13: qty 4

## 2016-02-13 MED ORDER — PRENATAL MULTIVITAMIN CH
1.0000 | ORAL_TABLET | Freq: Every day | ORAL | Status: DC
  Administered 2016-02-13: 1 via ORAL
  Filled 2016-02-13 (×2): qty 1

## 2016-02-13 MED ORDER — PHENYLEPHRINE 40 MCG/ML (10ML) SYRINGE FOR IV PUSH (FOR BLOOD PRESSURE SUPPORT)
80.0000 ug | PREFILLED_SYRINGE | INTRAVENOUS | Status: DC | PRN
  Filled 2016-02-13: qty 5

## 2016-02-13 MED ORDER — ZOLPIDEM TARTRATE 5 MG PO TABS: 5.0000 mg | ORAL_TABLET | Freq: Every evening | ORAL | Status: DC | PRN

## 2016-02-13 MED ORDER — WITCH HAZEL-GLYCERIN EX PADS: 1.0000 "application " | MEDICATED_PAD | CUTANEOUS | Status: DC | PRN

## 2016-02-13 MED ORDER — DIPHENHYDRAMINE HCL 25 MG PO CAPS: 25.0000 mg | ORAL_CAPSULE | Freq: Four times a day (QID) | ORAL | Status: DC | PRN

## 2016-02-13 MED ORDER — DIPHENHYDRAMINE HCL 50 MG/ML IJ SOLN: 12.5000 mg | INTRAMUSCULAR | Status: DC | PRN

## 2016-02-13 MED ORDER — FENTANYL 2.5 MCG/ML BUPIVACAINE 1/10 % EPIDURAL INFUSION (WH - ANES)
14.0000 mL/h | INTRAMUSCULAR | Status: DC | PRN
  Administered 2016-02-13 (×2): 14 mL/h via EPIDURAL
  Filled 2016-02-13: qty 100

## 2016-02-13 MED ORDER — ONDANSETRON HCL 4 MG/2ML IJ SOLN: 4.0000 mg | INTRAMUSCULAR | Status: DC | PRN

## 2016-02-13 MED ORDER — TETANUS-DIPHTH-ACELL PERTUSSIS 5-2.5-18.5 LF-MCG/0.5 IM SUSP: 0.5000 mL | Freq: Once | INTRAMUSCULAR | Status: DC

## 2016-02-13 MED ORDER — ACETAMINOPHEN 325 MG PO TABS: 650.0000 mg | ORAL_TABLET | ORAL | Status: DC | PRN

## 2016-02-13 MED ORDER — MEDROXYPROGESTERONE ACETATE 150 MG/ML IM SUSP: 150.0000 mg | INTRAMUSCULAR | Status: DC | PRN

## 2016-02-13 MED ORDER — DIBUCAINE 1 % RE OINT
1.0000 "application " | TOPICAL_OINTMENT | RECTAL | Status: DC | PRN
  Filled 2016-02-13: qty 28

## 2016-02-13 MED ORDER — ONDANSETRON HCL 4 MG PO TABS: 4.0000 mg | ORAL_TABLET | ORAL | Status: DC | PRN

## 2016-02-13 MED ORDER — SENNOSIDES-DOCUSATE SODIUM 8.6-50 MG PO TABS
2.0000 | ORAL_TABLET | ORAL | Status: DC
  Administered 2016-02-13: 2 via ORAL
  Filled 2016-02-13: qty 2

## 2016-02-13 MED ORDER — FENTANYL 2.5 MCG/ML BUPIVACAINE 1/10 % EPIDURAL INFUSION (WH - ANES)
INTRAMUSCULAR | Status: AC
  Filled 2016-02-13: qty 100

## 2016-02-13 MED ORDER — LACTATED RINGERS IV SOLN
500.0000 mL | Freq: Once | INTRAVENOUS | Status: AC
  Administered 2016-02-13: 500 mL via INTRAVENOUS

## 2016-02-13 NOTE — Anesthesia Procedure Notes (Signed)
Epidural Patient location during procedure: OB Start time: 02/13/2016 12:49 AM End time: 02/13/2016 12:59 AM  Staffing Anesthesiologist: Cristela BlueJACKSON, Luana Tatro  Preanesthetic Checklist Completed: patient identified, site marked, surgical consent, pre-op evaluation, timeout performed, IV checked, risks and benefits discussed and monitors and equipment checked  Epidural Patient position: sitting Prep: site prepped and draped and DuraPrep Patient monitoring: continuous pulse ox and blood pressure Approach: midline Location: L3-L4 Injection technique: LOR air  Needle:  Needle type: Tuohy  Needle gauge: 17 G Needle length: 9 cm and 9 Needle insertion depth: 5 cm cm Catheter type: closed end flexible Catheter size: 19 Gauge Catheter at skin depth: 10 cm Test dose: negative  Assessment Events: blood not aspirated, injection not painful, no injection resistance, negative IV test and no paresthesia  Additional Notes Dosing of Epidural:  1st dose, through catheter ............................................Marland Kitchen.  Xylocaine 40 mg  2nd dose, through catheter, after waiting 3 minutes........Marland Kitchen.Xylocaine 60 mg    As each dose occurred, patient was free of IV sx; and patient exhibited no evidence of SA injection.  Patient is more comfortable after epidural dosed. Please see RN's note for documentation of vital signs,and FHR which are stable.  Patient reminded not to try to ambulate with numb legs, and that an RN must be present when she attempts to get up.

## 2016-02-13 NOTE — Anesthesia Preprocedure Evaluation (Signed)

## 2016-02-13 NOTE — Anesthesia Procedure Notes (Signed)
Procedures

## 2016-02-13 NOTE — Lactation Note (Signed)
This note was copied from a baby's chart. Lactation Consultation Note  Patient Name: Boy Ledell NossBrandy Maimone ZOXWR'UToday's Date: 02/13/2016 Reason for consult: Initial assessment Breastfeeding consultation services and support information given and reviewed.  This is mom's second baby and newborn is 858 hours old.  Mom states baby initially latched and fed twice but recently sleepy.  She is attempting skin to skin to encourage baby to feed.  Instructed to continue and to watch for feeding cues.  Encouraged to call for concerns/assist prn.  Maternal Data Does the patient have breastfeeding experience prior to this delivery?: Yes  Feeding    LATCH Score/Interventions                      Lactation Tools Discussed/Used     Consult Status Consult Status: Follow-up Date: 02/14/16 Follow-up type: In-patient    Huston FoleyMOULDEN, Cambell Stanek S 02/13/2016, 2:12 PM

## 2016-02-13 NOTE — H&P (Signed)
Lynn Barnett is a 36 y.o. G 4 P 2 at 4439 w 2 days presents in active labor. Now status post epidural. OB History    Gravida Para Term Preterm AB Living   4 1 1   2 1    SAB TAB Ectopic Multiple Live Births   2       1     Past Medical History:  Diagnosis Date  . Hx of varicella   . No pertinent past medical history    Past Surgical History:  Procedure Laterality Date  . DILATION AND CURETTAGE OF UTERUS    . DILATION AND EVACUATION  01/22/2011   Procedure: DILATATION AND EVACUATION;  Surgeon: Turner Danielsavid C Lowe, MD;  Location: [facility];  Service: Gynecology;  Laterality: N/A;  with genetic studies  . WISDOM TOOTH EXTRACTION  2002   Family History: family history includes Diabetes in her cousin; Heart attack in her maternal grandfather; Hypertension in her father and paternal grandmother; Mental illness in her cousin; Thyroid disease in her cousin. Social History:  reports that she has never smoked. She has never used smokeless tobacco. She reports that she drinks about 0.6 oz of alcohol per week . She reports that she does not use drugs.     Maternal Diabetes: No Genetic Screening: Normal Maternal Ultrasounds/Referrals: Normal Fetal Ultrasounds or other Referrals:  None Maternal Substance Abuse:  No Significant Maternal Medications:  None Significant Maternal Lab Results:  None Other Comments:  None  Review of Systems  All other systems reviewed and are negative.  Maternal Medical History:  Prenatal complications: no prenatal complications   Dilation: 10 Effacement (%): 100 Station: +2 Exam by:: Sabriya Yono  Blood pressure 120/73, pulse 97, temperature 98.6 F (37 C), temperature source Oral, resp. rate 18, height 5\' 3"  (1.6 m), weight 178 lb (80.7 kg), SpO2 99 %, unknown if currently breastfeeding. Maternal Exam:  Abdomen: Fetal presentation: vertex     Fetal Exam Fetal State Assessment: Category I - tracings are normal.     Physical Exam  Prenatal labs: ABO, Rh:    Antibody:   Rubella: Immune (06/12 0000) RPR: Nonreactive (06/12 0000)  HBsAg: Negative (06/12 0000)  HIV: Non-reactive (06/12 0000)  GBS: Negative (12/18 0000)   Assessment/Plan: IUP at 39 w 2 days Labor   Lynn Barnett L 02/13/2016, 5:23 AM

## 2016-02-13 NOTE — Anesthesia Postprocedure Evaluation (Signed)
Anesthesia Post Note  Patient: Lynn Barnett  Procedure(s) Performed: * No procedures listed *  Patient location during evaluation: Mother Baby Anesthesia Type: Epidural Level of consciousness: awake, awake and alert, oriented and patient cooperative Pain management: pain level controlled Vital Signs Assessment: post-procedure vital signs reviewed and stable Respiratory status: spontaneous breathing, nonlabored ventilation and respiratory function stable Cardiovascular status: stable Postop Assessment: no headache, no backache, no signs of nausea or vomiting and patient able to bend at knees Anesthetic complications: no        Last Vitals:  Vitals:   02/13/16 0915 02/13/16 1315  BP: 121/68 (!) 104/59  Pulse: 81 63  Resp: 16 16  Temp: 36.5 C 36.7 C    Last Pain:  Vitals:   02/13/16 1315  TempSrc: Oral  PainSc:    Pain Goal: Patients Stated Pain Goal: 2 (02/13/16 0915)               Jim Philemon L

## 2016-02-14 LAB — CBC
HCT: 28.8 % — ABNORMAL LOW (ref 36.0–46.0)
HEMOGLOBIN: 9.7 g/dL — AB (ref 12.0–15.0)
MCH: 31.7 pg (ref 26.0–34.0)
MCHC: 33.7 g/dL (ref 30.0–36.0)
MCV: 94.1 fL (ref 78.0–100.0)
Platelets: 132 10*3/uL — ABNORMAL LOW (ref 150–400)
RBC: 3.06 MIL/uL — AB (ref 3.87–5.11)
RDW: 16 % — ABNORMAL HIGH (ref 11.5–15.5)
WBC: 9.2 10*3/uL (ref 4.0–10.5)

## 2016-02-14 MED ORDER — IBUPROFEN 600 MG PO TABS: 600.0000 mg | ORAL_TABLET | Freq: Four times a day (QID) | ORAL | 0 refills | Status: DC

## 2016-02-14 NOTE — Lactation Note (Signed)
This note was copied from a baby's chart. Lactation Consultation Note  Patient Name: Lynn Barnett WFUXN'AToday's Date: 02/14/2016 Reason for consult: Follow-up assessment   With this experienced breast feeding mom and term baby, now 10326 hours old. Mom and baby are being discharged to home today. Mom reports some nipple tenderness. Mom was latching in cradle hold, and I had her try cross cradle and then switch to cradle. The baby is large, latched easily and deeply, and mom reports latch comfortable. Some basic breast feeding teahcing reviewed, and lactation services available also reviewed with mom.MOm with history of low milk supply with first baby, and encouraged to call for o/p consult prn.  Mom knows to call lactation for questions/conerns.    Maternal Data    Feeding Feeding Type: Breast Fed Length of feed: 10 min  LATCH Score/Interventions Latch: Grasps breast easily, tongue down, lips flanged, rhythmical sucking. Intervention(s): Skin to skin;Teach feeding cues;Waking techniques  Audible Swallowing: Spontaneous and intermittent  Type of Nipple: Everted at rest and after stimulation  Comfort (Breast/Nipple): Soft / non-tender  Problem noted: Mild/Moderate discomfort Interventions (Mild/moderate discomfort): Hand massage;Hand expression  Hold (Positioning): Assistance needed to correctly position infant at breast and maintain latch.  LATCH Score: 9  Lactation Tools Discussed/Used     Consult Status Consult Status: Complete Date: 02/15/16 Follow-up type: Call as needed    Lynn Barnett, Lynn Barnett 02/14/2016, 8:22 AM

## 2016-02-14 NOTE — Progress Notes (Signed)
Post Partum Day 1 Subjective: no complaints, up ad lib, voiding, tolerating PO and + flatus  Objective: Blood pressure 105/61, pulse 60, temperature 97.7 F (36.5 C), temperature source Oral, resp. rate 18, height 5\' 3"  (1.6 m), weight 178 lb (80.7 kg), SpO2 97 %, unknown if currently breastfeeding.  Physical Exam:  General: alert and cooperative Lochia: appropriate Uterine Fundus: firm Incision: healing well DVT Evaluation: No evidence of DVT seen on physical exam. Negative Homan's sign. No cords or calf tenderness. No significant calf/ankle edema.   Recent Labs  02/12/16 2311 02/14/16 0532  HGB 11.3* 9.7*  HCT 33.4* 28.8*    Assessment/Plan: Discharge home and Circumcision prior to discharge   LOS: 2 days   Ranelle Auker G 02/14/2016, 7:52 AM

## 2016-02-14 NOTE — Lactation Note (Signed)
This note was copied from a baby's chart. Lactation Consultation Note Experienced BF BF her oldest child for 4 months. Mom states BF going well. Nursing hasn't seen a latch this shift. Encouraged mom to call for latch. Baby laying in mom's lap, mom stated she tried to BF about 30 mins. Ago but baby wasn't interested. Baby sleeping  Mom states the baby has been BF well. Asked mom to assess breast. Has good everted nipples and colostrum expressed.  [facility] brochure given w/resources, support groups and LC services. Mom encouraged to feed baby 8-12 times/24 hours and with feeding cues. Educated about newborn behavior, I&O, STS, cluster feeding.   Patient Name: Lynn Ledell NossBrandy Barnett UJWJX'BToday's Date: 02/14/2016 Reason for consult: Follow-up assessment   Maternal Data    Feeding Feeding Type: Breast Fed Length of feed: 0 min  LATCH Score/Interventions Latch: Too sleepy or reluctant, no latch achieved, no sucking elicited.     Type of Nipple: Everted at rest and after stimulation  Comfort (Breast/Nipple): Filling, red/small blisters or bruises, mild/mod discomfort  Problem noted: Mild/Moderate discomfort Interventions (Mild/moderate discomfort): Hand massage;Hand expression        Lactation Tools Discussed/Used     Consult Status Consult Status: Follow-up Date: 02/15/16 Follow-up type: In-patient    Charyl DancerCARVER, Xcaret Morad G 02/14/2016, 6:43 AM

## 2016-02-14 NOTE — Discharge Summary (Signed)
Obstetric Discharge Summary Reason for Admission: onset of labor Prenatal Procedures: ultrasound Intrapartum Procedures: vacuum Postpartum Procedures: none Complications-Operative and Postpartum: labial laceration Hemoglobin  Date Value Ref Range Status  02/14/2016 9.7 (L) 12.0 - 15.0 g/dL Final   HCT  Date Value Ref Range Status  02/14/2016 28.8 (L) 36.0 - 46.0 % Final    Physical Exam:  General: alert and cooperative Lochia: appropriate Uterine Fundus: firm Incision: healing well DVT Evaluation: No evidence of DVT seen on physical exam. Negative Homan's sign. No cords or calf tenderness. No significant calf/ankle edema.  Discharge Diagnoses: Term Pregnancy-delivered  Discharge Information: Date: 02/14/2016 Activity: pelvic rest Diet: routine Medications: PNV and Ibuprofen Condition: stable Instructions: refer to practice specific booklet Discharge to: home   Newborn Data: Live born female  Birth Weight: 9 lb 7.7 oz (4300 g) APGAR: 7, 9  Home with mother.  Perri Aragones G 02/14/2016, 8:02 AM

## 2016-02-15 LAB — TYPE AND SCREEN
ABO/RH(D): B NEG
ANTIBODY SCREEN: POSITIVE
DAT, IgG: NEGATIVE
UNIT DIVISION: 0
Unit division: 0
Unit division: 0

## 2017-05-07 ENCOUNTER — Emergency Department (HOSPITAL_COMMUNITY): Payer: BC Managed Care – PPO

## 2017-05-07 ENCOUNTER — Other Ambulatory Visit: Payer: Self-pay

## 2017-05-07 ENCOUNTER — Emergency Department (HOSPITAL_COMMUNITY)
Admission: EM | Admit: 2017-05-07 | Discharge: 2017-05-07 | Disposition: A | Discharge status: Home / Self Care | Payer: BC Managed Care – PPO | Attending: Emergency Medicine | Admitting: Emergency Medicine

## 2017-05-07 ENCOUNTER — Encounter (HOSPITAL_COMMUNITY): Payer: Self-pay

## 2017-05-07 DIAGNOSIS — N200 Calculus of kidney: Secondary | ICD-10-CM

## 2017-05-07 DIAGNOSIS — Z79899 Other long term (current) drug therapy: Secondary | ICD-10-CM | POA: Diagnosis not present

## 2017-05-07 DIAGNOSIS — R1032 Left lower quadrant pain: Secondary | ICD-10-CM | POA: Diagnosis present

## 2017-05-07 LAB — URINALYSIS, ROUTINE W REFLEX MICROSCOPIC
Bilirubin Urine: NEGATIVE
GLUCOSE, UA: NEGATIVE mg/dL
Ketones, ur: 5 mg/dL — AB
LEUKOCYTES UA: NEGATIVE
NITRITE: NEGATIVE
PH: 7 (ref 5.0–8.0)
Protein, ur: NEGATIVE mg/dL
Specific Gravity, Urine: 1.014 (ref 1.005–1.030)

## 2017-05-07 LAB — CBC
HEMATOCRIT: 37.8 % (ref 36.0–46.0)
HEMOGLOBIN: 13.1 g/dL (ref 12.0–15.0)
MCH: 31.5 pg (ref 26.0–34.0)
MCHC: 34.7 g/dL (ref 30.0–36.0)
MCV: 90.9 fL (ref 78.0–100.0)
Platelets: 272 10*3/uL (ref 150–400)
RBC: 4.16 MIL/uL (ref 3.87–5.11)
RDW: 12.7 % (ref 11.5–15.5)
WBC: 11.8 10*3/uL — AB (ref 4.0–10.5)

## 2017-05-07 LAB — COMPREHENSIVE METABOLIC PANEL
ALK PHOS: 89 U/L (ref 38–126)
ALT: 22 U/L (ref 14–54)
AST: 23 U/L (ref 15–41)
Albumin: 4.4 g/dL (ref 3.5–5.0)
Anion gap: 13 (ref 5–15)
BILIRUBIN TOTAL: 0.4 mg/dL (ref 0.3–1.2)
BUN: 10 mg/dL (ref 6–20)
CALCIUM: 9.6 mg/dL (ref 8.9–10.3)
CO2: 22 mmol/L (ref 22–32)
Chloride: 105 mmol/L (ref 101–111)
Creatinine, Ser: 0.64 mg/dL (ref 0.44–1.00)
GFR calc Af Amer: >60 mL/min (ref >60)
GLUCOSE: 152 mg/dL — AB (ref 65–99)
Potassium: 3.3 mmol/L — ABNORMAL LOW (ref 3.5–5.1)
Sodium: 140 mmol/L (ref 135–145)
TOTAL PROTEIN: 7.3 g/dL (ref 6.5–8.1)

## 2017-05-07 LAB — I-STAT BETA HCG BLOOD, ED (MC, WL, AP ONLY): I-stat hCG, quantitative: <5 m[IU]/mL (ref <5)

## 2017-05-07 LAB — LIPASE, BLOOD: Lipase: 25 U/L (ref 11–51)

## 2017-05-07 MED ORDER — ONDANSETRON 4 MG PO TBDP: 4.0000 mg | ORAL_TABLET | Freq: Three times a day (TID) | ORAL | 0 refills | Status: DC | PRN

## 2017-05-07 MED ORDER — KETOROLAC TROMETHAMINE 30 MG/ML IJ SOLN
30.0000 mg | Freq: Once | INTRAMUSCULAR | Status: AC
  Administered 2017-05-07: 30 mg via INTRAVENOUS
  Filled 2017-05-07: qty 1

## 2017-05-07 MED ORDER — KETOROLAC TROMETHAMINE 30 MG/ML IJ SOLN: 30.0000 mg | Freq: Once | INTRAMUSCULAR | Status: DC

## 2017-05-07 MED ORDER — TAMSULOSIN HCL 0.4 MG PO CAPS: 0.4000 mg | ORAL_CAPSULE | Freq: Every day | ORAL | 0 refills | Status: DC

## 2017-05-07 MED ORDER — OXYCODONE-ACETAMINOPHEN 5-325 MG PO TABS
1.0000 | ORAL_TABLET | Freq: Once | ORAL | Status: AC
  Administered 2017-05-07: 1 via ORAL
  Filled 2017-05-07: qty 1

## 2017-05-07 MED ORDER — OXYCODONE-ACETAMINOPHEN 5-325 MG PO TABS: 1.0000 | ORAL_TABLET | Freq: Four times a day (QID) | ORAL | 0 refills | Status: DC | PRN

## 2017-05-07 NOTE — ED Triage Notes (Signed)
Pt reports LLQ cramping with radiation to the left flank area. Pt also reports nausea and one episode of vomiting. She reports family hx of diverticulitis. Reports she had wbc count last week that was normal.

## 2017-05-07 NOTE — ED Notes (Signed)
Pt ambulatory to room with steady gait. UA sent to main lab and urine culture obtained and temporary label applied

## 2017-05-07 NOTE — ED Notes (Signed)
Patient transported to CT 

## 2017-05-07 NOTE — ED Provider Notes (Signed)
[facility] EMERGENCY DEPARTMENT Provider Note   CSN: [id number] Arrival date & time: 05/07/17  1205     History   Chief Complaint Chief Complaint  Patient presents with  . Abdominal Pain  . Flank Pain    HPI Lynn Barnett is a 37 y.o. female.  HPI   37 year old female presents today with complaints of left lower abdomen and flank pain.  Patient notes last week she went to walk-in clinic with complaints of left lower quadrant pain.  She had normal laboratory analysis and was discharged home.  She notes the pain went away after not eating solid foods, but again returned this morning with severe left lower quadrant abdominal pain with radiation to the back and flank.  She notes that her urine has been normal, although she has had urgency.  She reports some nausea and vomiting, denies any fever, denies any dysuria.  Patient denies any vaginal discharge.  Patient is not breast-feeding currently.      Past Medical History:  Diagnosis Date  . Hx of varicella   . No pertinent past medical history     Patient Active Problem List   Diagnosis Date Noted  . Vacuum extraction, delivered, current hospitalization 02/13/2016  . Indication for care in labor or delivery 02/12/2016    Past Surgical History:  Procedure Laterality Date  . DILATION AND CURETTAGE OF UTERUS    . DILATION AND EVACUATION  01/22/2011   Procedure: DILATATION AND EVACUATION;  Surgeon: Turner Daniels, MD;  Location: [facility];  Service: Gynecology;  Laterality: N/A;  with genetic studies  . WISDOM TOOTH EXTRACTION  2002     OB History    Gravida  4   Para  2   Term  2   Preterm      AB  2   Living  2     SAB  2   TAB      Ectopic      Multiple  0   Live Births  2           Home Medications    Prior to Admission medications   Medication Sig Start Date End Date Taking? Authorizing Provider  ibuprofen (ADVIL,MOTRIN) 600 MG tablet Take 1 tablet (600 mg total) by mouth every  6 (six) hours. 02/14/16   Julio Sicks, NP  ondansetron (ZOFRAN ODT) 4 MG disintegrating tablet Take 1 tablet (4 mg total) by mouth every 8 (eight) hours as needed for nausea or vomiting. 05/07/17   Yvonnie Schinke, Tinnie Gens, PA-C  oxyCODONE-acetaminophen (PERCOCET/ROXICET) 5-325 MG tablet Take 1 tablet by mouth every 6 (six) hours as needed for severe pain. 05/07/17   Promise Weldin, Tinnie Gens, PA-C  oxymetazoline (AFRIN) 0.05 % nasal spray Place 1 spray into both nostrils at bedtime as needed for congestion.    [provider]  Prenatal Vit-Fe Fumarate-FA (PRENATAL MULTIVITAMIN) TABS Take 1 tablet by mouth daily at 12 noon.    [provider]  tamsulosin (FLOMAX) 0.4 MG CAPS capsule Take 1 capsule (0.4 mg total) by mouth daily. 05/07/17   Eyvonne Mechanic, PA-C    Family History Family History  Problem Relation Age of Onset  . Hypertension Father   . Heart attack Maternal Grandfather   . Hypertension Paternal Grandmother   . Diabetes Cousin   . Mental illness Cousin        bipolar  . Thyroid disease Cousin     Social History Social History   Tobacco Use  .  Smoking status: Never Smoker  . Smokeless tobacco: Never Used  Substance Use Topics  . Alcohol use: Yes    Alcohol/week: 0.6 oz    Types: 1 Glasses of wine per week  . Drug use: No     Allergies   Patient has no known allergies.   Review of Systems Review of Systems  All other systems reviewed and are negative.  Physical Exam Updated Vital Signs BP (!) 157/94   Pulse (!) 53   Temp 98.2 F (36.8 C) (Oral)   Resp 18   LMP 04/30/2017 (Within Days)   SpO2 100%   Physical Exam  Constitutional: She is oriented to person, place, and time. She appears well-developed and well-nourished.  HENT:  Head: Normocephalic and atraumatic.  Eyes: Pupils are equal, round, and reactive to light. Conjunctivae are normal. Right eye exhibits no discharge. Left eye exhibits no discharge. No scleral icterus.  Neck: Normal range of motion. No  JVD present. No tracheal deviation present.  Pulmonary/Chest: Effort normal. No stridor.  Abdominal:  Minor TTP to left lower quadrant -remainder of exam without acute abnormality   Neurological: She is alert and oriented to person, place, and time. Coordination normal.  Psychiatric: She has a normal mood and affect. Her behavior is normal. Judgment and thought content normal.  Nursing note and vitals reviewed.   ED Treatments / Results  Labs (all labs ordered are listed, but only abnormal results are displayed) Labs Reviewed  COMPREHENSIVE METABOLIC PANEL - Abnormal; Notable for the following components:      Result Value   Potassium 3.3 (*)    Glucose, Bld 152 (*)    All other components within normal limits  CBC - Abnormal; Notable for the following components:   WBC 11.8 (*)    All other components within normal limits  URINALYSIS, ROUTINE W REFLEX MICROSCOPIC - Abnormal; Notable for the following components:   Color, Urine STRAW (*)    Hgb urine dipstick MODERATE (*)    Ketones, ur 5 (*)    Bacteria, UA RARE (*)    Squamous Epithelial / LPF 0-5 (*)    All other components within normal limits  LIPASE, BLOOD  I-STAT BETA HCG BLOOD, ED ([facility], [facility], [facility] ONLY)    EKG None  Radiology Ct Renal Stone Study  Result Date: 05/07/2017 CLINICAL DATA:  Left flank region pain EXAM: CT ABDOMEN AND PELVIS WITHOUT CONTRAST TECHNIQUE: Multidetector CT imaging of the abdomen and pelvis was performed following the standard protocol without oral or IV contrast. COMPARISON:  None. FINDINGS: Lower chest: Lung bases are clear. Hepatobiliary: Liver measures 19.9 cm in length. No focal liver lesions are evident on this noncontrast enhanced study. There is mild fatty infiltration near the fissure for the ligamentum teres. Gallbladder wall is not appreciably thickened. There is no biliary dilatation. Pancreas: There is no pancreatic mass or inflammatory focus. Spleen: No splenic lesions are evident.  Adrenals/Urinary Tract: Adrenals bilaterally appear unremarkable. Kidneys bilaterally show no evident mass. There is moderate hydronephrosis on the left. There is no appreciable hydronephrosis on the right. There is no intrarenal calculus on either side. There is a 3 x 2 mm calculus at the left ureterovesical junction. No other ureteral calculi are evident. Urinary bladder is midline with wall thickness within normal limits. Stomach/Bowel: There is mild fatty prominence with stranding at the junction of the descending colon and proximal sigmoid colon consistent with localized epiploic appendagitis. No evident diverticulitis. Elsewhere no bowel wall thickening or mesenteric thickening.  No bowel obstruction. No free air or portal venous air. Vascular/Lymphatic: No abdominal aortic aneurysm. There is atherosclerotic calcification in the mid aorta. Major mesenteric vessels appear patent on this noncontrast enhanced study. No adenopathy evident in the abdomen or pelvis. Reproductive: Uterus is anteverted.  No pelvic mass evident. Other: Appendix appears normal. No abscess or ascites evident in the abdomen or pelvis. Musculoskeletal: No blastic or lytic bone lesions. 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 No intramuscular or abdominal wall lesion evident. IMPRESSION: 1. 3 x 2 mm calculus at the left ureterovesical junction with moderate hydronephrosis and ureterectasis on the left. 2. Changes of epiploic appendagitis at the descending colon-sigmoid colon junction. No diverticulitis. No abscess or perforation in this area. 3.  Appendix appears normal.  No bowel obstruction.  No abscess. 4. Liver mildly prominent without focal lesion beyond mild fatty infiltration near the fissure for the ligamentum teres. 5.  Aortic atherosclerosis. Aortic Atherosclerosis (ICD10-I70.0). Electronically Signed   By: Bretta BangWilliam  Woodruff III M.D.   On: 05/07/2017 13:13    Procedures Procedures (including critical care  time)  Medications Ordered in ED Medications  ketorolac (TORADOL) 30 MG/ML injection 30 mg (30 mg Intravenous Given 05/07/17 1236)  oxyCODONE-acetaminophen (PERCOCET/ROXICET) 5-325 MG per tablet 1 tablet (1 tablet Oral Given 05/07/17 1341)     Initial Impression / Assessment and Plan / ED Course  I have reviewed the triage vital signs and the nursing notes.  Pertinent labs & imaging results that were available during my care of the patient were reviewed by me and considered in my medical decision making (see chart for details).      Final Clinical Impressions(s) / ED Diagnoses   Final diagnoses:  Kidney stone    Labs: Urinalysis, CMP, CBC  Imaging: CT renal  Consults:  Therapeutics: Toradol, Percocet  Discharge Meds: percocet, Zofran, Flomax  Assessment/Plan: Patient's presentation is most consistent with ureterolithiasis.  This appears to be down at the ureterovesical junction and is a smaller stone.  She has no comp gating features with no significant change in creatinine, slightly hypokalemic.  She has no signs of infectious etiology with a reassuring urinalysis and is afebrile.  Patient is stable for outpatient follow-up with urology, she is given strict return precautions, she verbalized understanding and agreement to today's plan had no further questions or concerns    ED Discharge Orders        Ordered    ondansetron (ZOFRAN ODT) 4 MG disintegrating tablet  Every 8 hours PRN     05/07/17 1347    tamsulosin (FLOMAX) 0.4 MG CAPS capsule  Daily     05/07/17 1347    oxyCODONE-acetaminophen (PERCOCET/ROXICET) 5-325 MG tablet  Every 6 hours PRN     05/07/17 1347       Eyvonne MechanicHedges, Latima Hamza, PA-C 05/07/17 1349    Bethann BerkshireZammit, Joseph, MD 05/08/17 1010

## 2017-05-07 NOTE — ED Notes (Signed)
Pt verbalized understanding of all d/c instructions, prescriptions, and f/u information. Opportunity for questioning and answers provided. VSS. All belongings with patient at this time. Pt ambulatory to lobby with steady gait.   

## 2017-05-07 NOTE — ED Notes (Signed)
ED Provider at bedside. 

## 2017-05-07 NOTE — Discharge Instructions (Addendum)
Please read attached information. If you experience any new or worsening signs or symptoms please return to the emergency room for evaluation. Please follow-up with your primary care provider or specialist as discussed. Please use medication prescribed only as directed and discontinue taking if you have any concerning signs or symptoms.   °

## 2018-09-25 IMAGING — CT CT RENAL STONE PROTOCOL
2 of 4 series · 15 of 46 positions shown, 17 images · non-contrast
Comparison: None.

CLINICAL DATA: Left flank region pain

EXAM:
CT ABDOMEN AND PELVIS WITHOUT CONTRAST
TECHNIQUE: Multidetector CT imaging of the abdomen and pelvis was performed
following the standard protocol without oral or IV contrast.

[Series 3: ap without · axial · non-contrast · 0.66mm/px · z∈[+631,+996]mm · 12 of 83 slices shown, 14 images]
[im 5/83  soft-tissue]
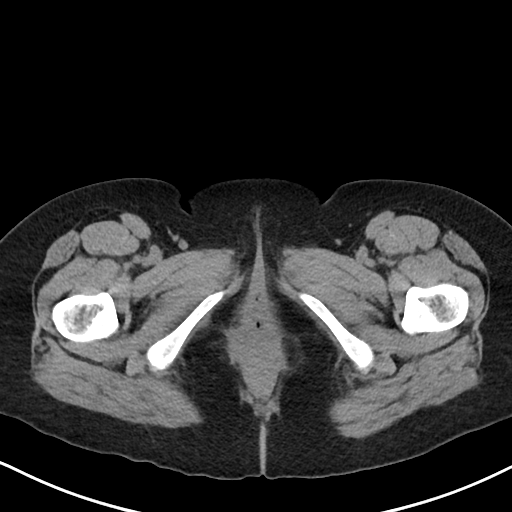
[im 5/83  bone]
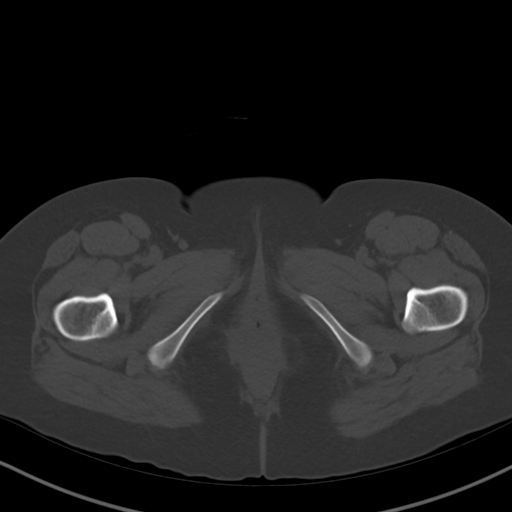
[im 14/83  soft-tissue]
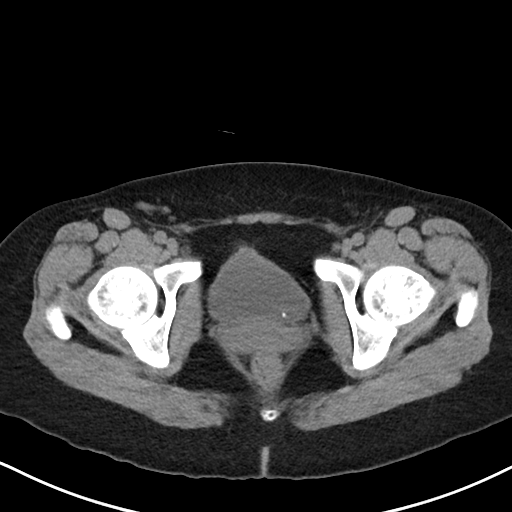
[im 19/83  soft-tissue]
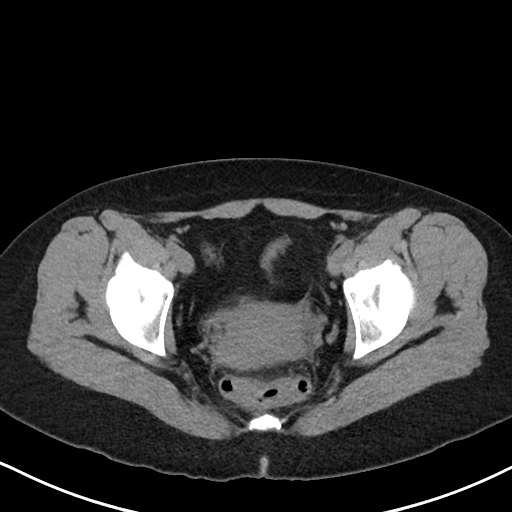
[im 23/83  soft-tissue]
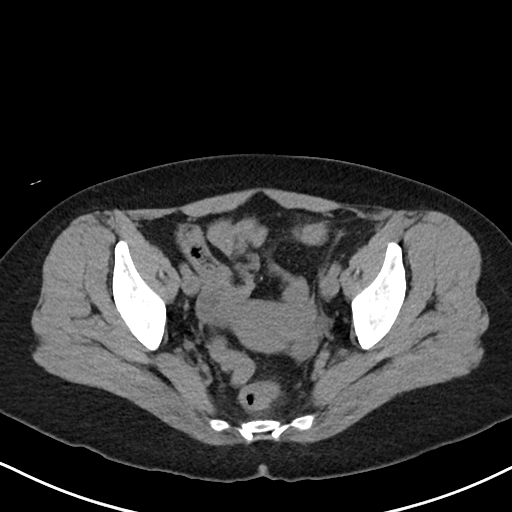
[im 32/83  soft-tissue]
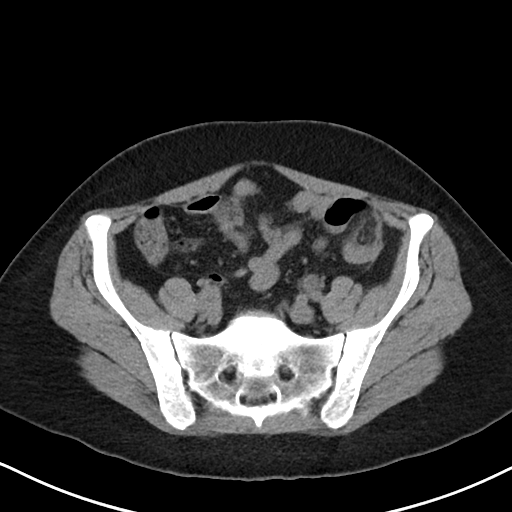
[im 37/83  soft-tissue]
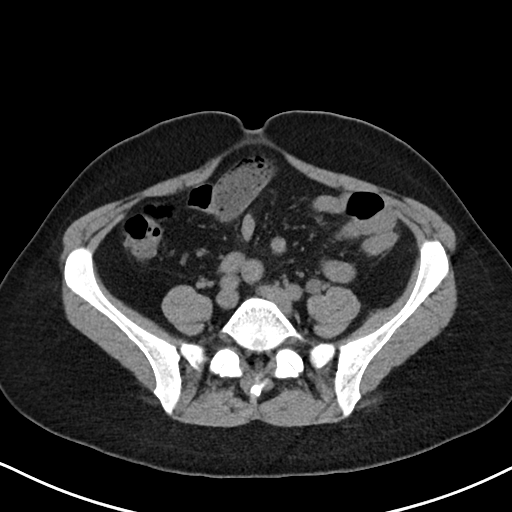
[im 46/83  soft-tissue]
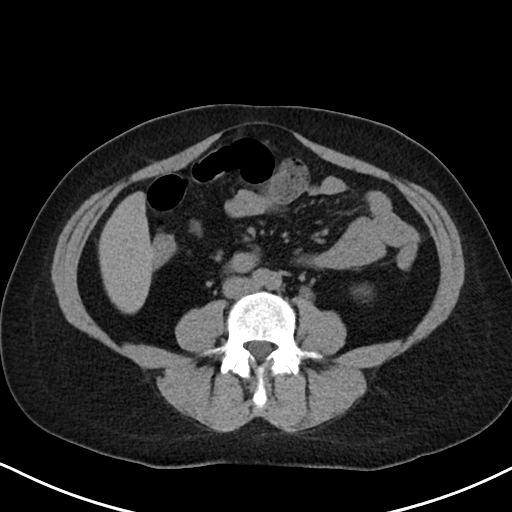
[im 51/83  soft-tissue]
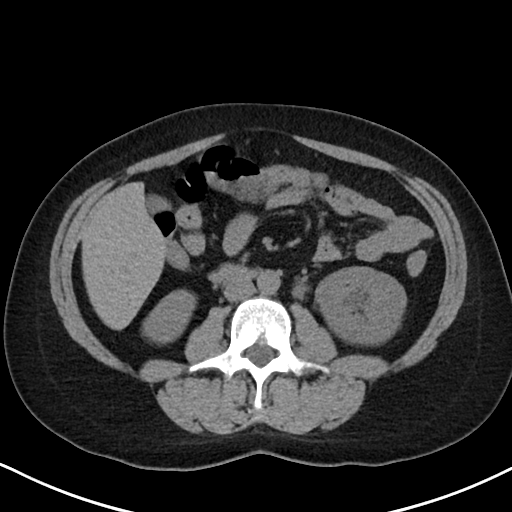
[im 60/83  soft-tissue]
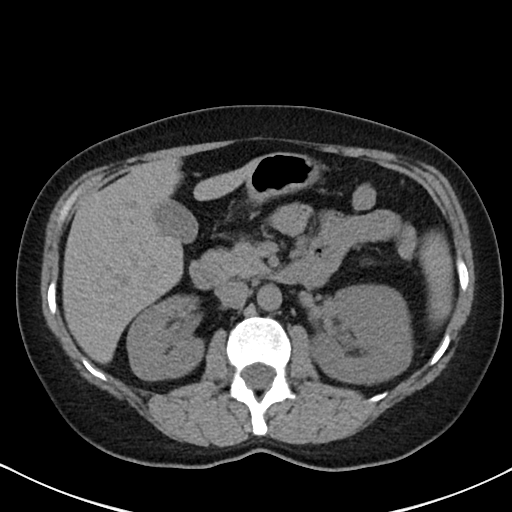
[im 60/83  bone]
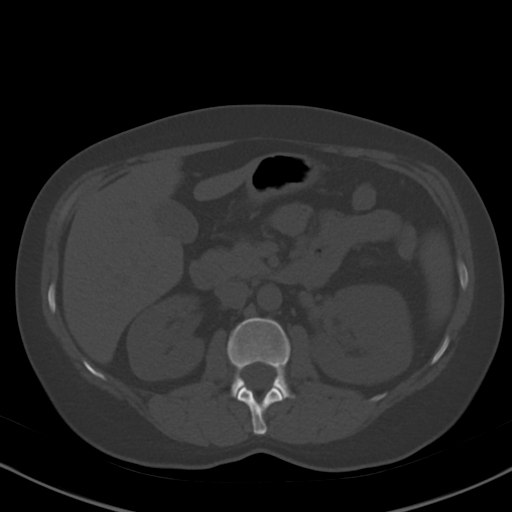
[im 64/83  soft-tissue]
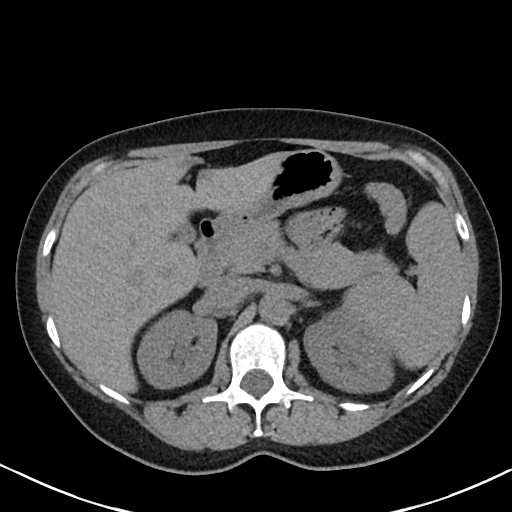
[im 69/83  soft-tissue]
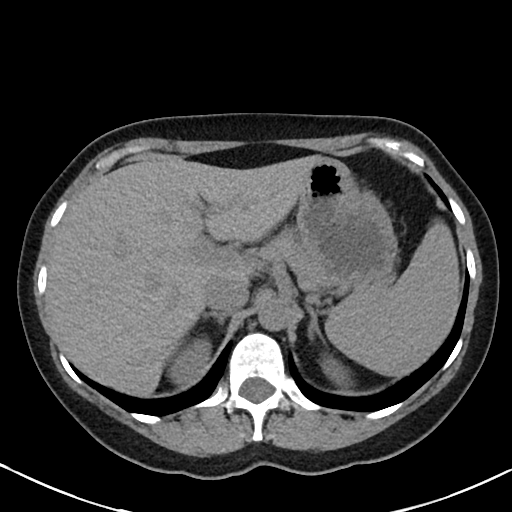
[im 78/83  soft-tissue]
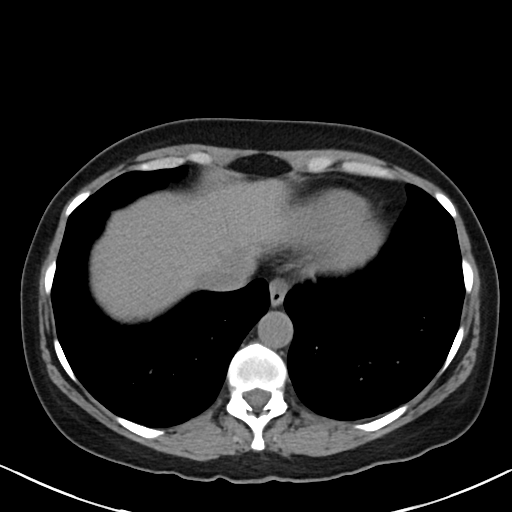

[Series 6: cor · coronal · 0.62mm/px · 3 of 101 slices shown]
[im 34/101  soft-tissue]
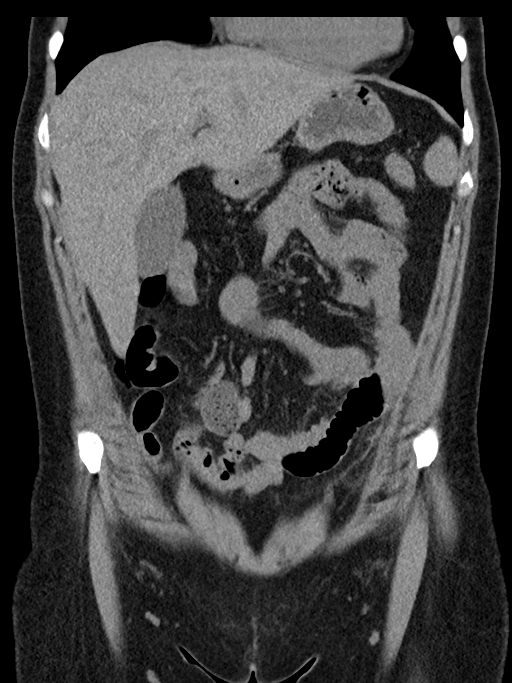
[im 45/101  soft-tissue]
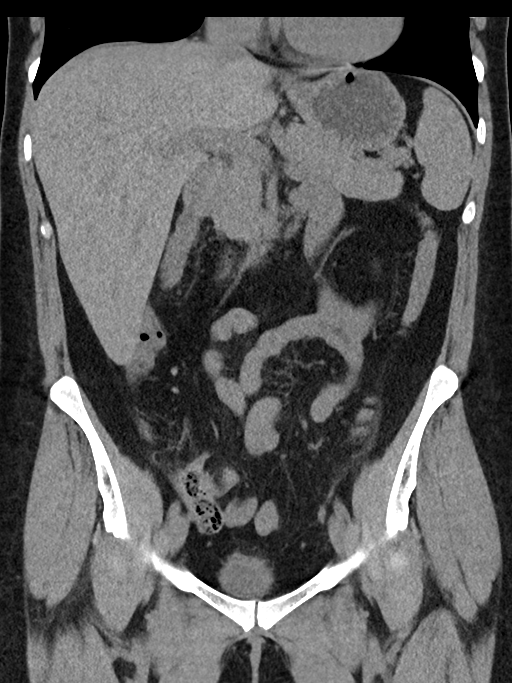
[im 56/101  soft-tissue]
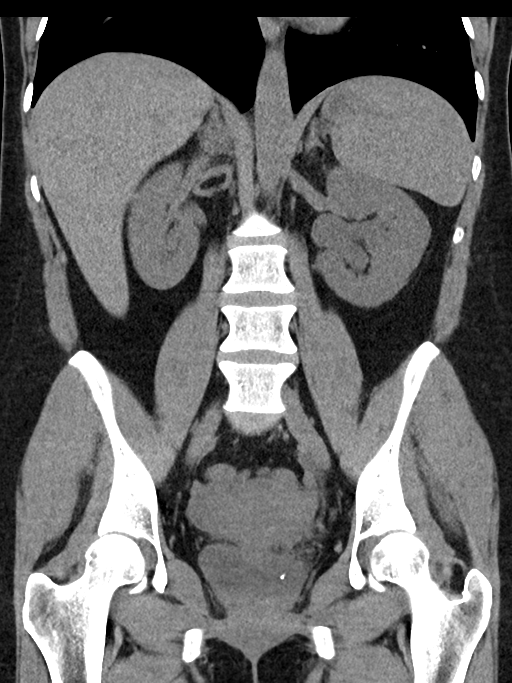

[15 of 46 positions shown; findings below may reference images not displayed]

FINDINGS: Lower chest: Lung bases are clear.

Hepatobiliary: Liver measures 19.9 cm in length. No focal liver
lesions are evident on this noncontrast enhanced study. There is
mild fatty infiltration near the fissure for the ligamentum teres.
Gallbladder wall is not appreciably thickened. There is no biliary
dilatation.

Pancreas: There is no pancreatic mass or inflammatory focus.

Spleen: No splenic lesions are evident.

Adrenals/Urinary Tract: Adrenals bilaterally appear unremarkable.
Kidneys bilaterally show no evident mass. There is moderate
hydronephrosis on the left. There is no appreciable hydronephrosis
on the right. There is no intrarenal calculus on either side. There
is a 3 x 2 mm calculus at the left ureterovesical junction. No other
ureteral calculi are evident. Urinary bladder is midline with wall
thickness within normal limits.

Stomach/Bowel: There is mild fatty prominence with stranding at the
junction of the descending colon and proximal sigmoid colon
consistent with localized epiploic appendagitis. No evident
diverticulitis. Elsewhere no bowel wall thickening or mesenteric
thickening. No bowel obstruction. No free air or portal venous air.

Vascular/Lymphatic: No abdominal aortic aneurysm. There is
atherosclerotic calcification in the mid aorta. Major mesenteric
vessels appear patent on this noncontrast enhanced study. No
adenopathy evident in the abdomen or pelvis.

Reproductive: Uterus is anteverted.  No pelvic mass evident.

Other: Appendix appears normal. No abscess or ascites evident in the
abdomen or pelvis.

Musculoskeletal: No blastic or lytic bone lesions. There is probable
congenital fusion at T12-L1. No intramuscular or abdominal wall
lesion evident.
IMPRESSION: 1. 3 x 2 mm calculus at the left ureterovesical junction with
moderate hydronephrosis and ureterectasis on the left.

2. Changes of epiploic appendagitis at the descending colon-sigmoid
colon junction. No diverticulitis. No abscess or perforation in this
area.

3.  Appendix appears normal.  No bowel obstruction.  No abscess.

4. Liver mildly prominent without focal lesion beyond mild fatty
infiltration near the fissure for the ligamentum teres.

5.  Aortic atherosclerosis.

Aortic Atherosclerosis ([id number]).

## 2021-02-05 NOTE — L&D Delivery Note (Signed)
Delivery Note At 9:11 AM a viable female was delivered via Vaginal, Spontaneous (Presentation: Left Occiput Anterior).  APGAR: 8, 9; weight  .pending   Placenta status: Spontaneous, Intact.  Cord: 3 vessels with the following complications: None.  Cord pH: none  Anesthesia: None Episiotomy: None Lacerations: None Suture Repair:  none Est. Blood Loss (mL): 300  Mom to postpartum.  Baby to Couplet care / Skin to Skin.  Cyril Mourning 11/08/2021, 9:24 AM

## 2021-04-20 LAB — HEPATITIS C ANTIBODY: HCV Ab: NEGATIVE

## 2021-04-20 LAB — OB RESULTS CONSOLE RPR: RPR: NONREACTIVE

## 2021-04-20 LAB — OB RESULTS CONSOLE HEPATITIS B SURFACE ANTIGEN: Hepatitis B Surface Ag: NEGATIVE

## 2021-04-20 LAB — OB RESULTS CONSOLE RUBELLA ANTIBODY, IGM: Rubella: IMMUNE

## 2021-04-20 LAB — OB RESULTS CONSOLE ABO/RH: RH Type: NEGATIVE

## 2021-04-20 LAB — OB RESULTS CONSOLE HIV ANTIBODY (ROUTINE TESTING): HIV: NONREACTIVE

## 2021-04-20 LAB — OB RESULTS CONSOLE ANTIBODY SCREEN: Antibody Screen: NEGATIVE

## 2021-04-27 LAB — OB RESULTS CONSOLE GC/CHLAMYDIA
Chlamydia: NEGATIVE
Neisseria Gonorrhea: NEGATIVE

## 2021-10-25 LAB — OB RESULTS CONSOLE GBS: GBS: NEGATIVE

## 2021-11-02 ENCOUNTER — Telehealth (HOSPITAL_COMMUNITY): Payer: Self-pay | Admitting: *Deleted

## 2021-11-02 ENCOUNTER — Encounter (HOSPITAL_COMMUNITY): Payer: Self-pay | Admitting: *Deleted

## 2021-11-02 NOTE — Telephone Encounter (Signed)
Preadmission screen  

## 2021-11-03 ENCOUNTER — Telehealth (HOSPITAL_COMMUNITY): Payer: Self-pay | Admitting: *Deleted

## 2021-11-03 ENCOUNTER — Encounter (HOSPITAL_COMMUNITY): Payer: Self-pay | Admitting: *Deleted

## 2021-11-03 ENCOUNTER — Encounter (HOSPITAL_COMMUNITY): Payer: Self-pay

## 2021-11-03 NOTE — Telephone Encounter (Signed)
Preadmission screen  

## 2021-11-07 ENCOUNTER — Other Ambulatory Visit: Payer: Self-pay

## 2021-11-07 ENCOUNTER — Inpatient Hospital Stay (HOSPITAL_COMMUNITY)
Admission: AD | Admit: 2021-11-07 | Discharge: 2021-11-09 | DRG: 807 | Disposition: A | Discharge status: Home / Self Care | Payer: BC Managed Care – PPO | Attending: Obstetrics and Gynecology | Admitting: Obstetrics and Gynecology

## 2021-11-07 DIAGNOSIS — O1002 Pre-existing essential hypertension complicating childbirth: Secondary | ICD-10-CM | POA: Diagnosis present

## 2021-11-07 DIAGNOSIS — Z3A37 37 weeks gestation of pregnancy: Secondary | ICD-10-CM

## 2021-11-07 DIAGNOSIS — O26893 Other specified pregnancy related conditions, third trimester: Secondary | ICD-10-CM | POA: Diagnosis present

## 2021-11-07 DIAGNOSIS — Z6791 Unspecified blood type, Rh negative: Secondary | ICD-10-CM

## 2021-11-07 DIAGNOSIS — O10919 Unspecified pre-existing hypertension complicating pregnancy, unspecified trimester: Principal | ICD-10-CM | POA: Diagnosis present

## 2021-11-07 NOTE — H&P (Signed)
Lynn Barnett is a 41 y.o. G 6 P 2032 at 45 w 6 days presents for IOL secondary to Chronic Hypertension  On Labetalol  Also Rh Negative  History of LGA 2nd pregnancy 9 pound 7 oz Ultrasound on October 2 EGA 7 pound 10 oz  OB History     Gravida  6   Para  2   Term  2   Preterm      AB  3   Living  2      SAB  3   IAB      Ectopic      Multiple  0   Live Births  2          Past Medical History:  Diagnosis Date   Hx of varicella    Kidney stone    No pertinent past medical history    Pregnancy induced hypertension    Past Surgical History:  Procedure Laterality Date   DILATION AND CURETTAGE OF UTERUS     DILATION AND EVACUATION  01/22/2011   Procedure: DILATATION AND EVACUATION;  Surgeon: Luz Lex, MD;  Location: [facility];  Service: Gynecology;  Laterality: N/A;  with genetic studies   WISDOM TOOTH EXTRACTION  2002   Family History: family history includes Diabetes in her cousin; Heart attack in her maternal grandfather; Hypertension in her father and paternal grandmother; Mental illness in her cousin; Thyroid disease in her cousin. Social History:  reports that she has never smoked. She has never used smokeless tobacco. She reports current alcohol use of about 1.0 standard drink of alcohol per week. She reports that she does not use drugs.     Maternal Diabetes: No Genetic Screening: Normal Maternal Ultrasounds/Referrals: Normal Fetal Ultrasounds or other Referrals:  None Maternal Substance Abuse:  No Significant Maternal Medications:  Meds include: Other:  Significant Maternal Lab Results:  Group B Strep negative Number of Prenatal Visits:greater than 3 verified prenatal visits Other Comments:  None  Review of Systems  All other systems reviewed and are negative.  History   unknown if currently breastfeeding. Exam Physical Exam Vitals and nursing note reviewed. Exam conducted with a chaperone present.  Constitutional:      Appearance:  Normal appearance.  Cardiovascular:     Pulses: Normal pulses.  Pulmonary:     Effort: Pulmonary effort is normal.  Neurological:     Mental Status: She is alert.     Prenatal labs: ABO, Rh: B/Negative/-- (03/16 0000) Antibody: Negative (03/16 0000) Rubella: Immune (03/16 0000) RPR: Nonreactive (03/16 0000)  HBsAg: Negative (03/16 0000)  HIV: Non-reactive (03/16 0000)  GBS: Negative/-- (09/20 0000)   Assessment/Plan: IUP at term Chronic Hypertension Cytotec/Pitocin/ Epidural prn  Cyril Mourning 11/07/2021, 8:53 AM

## 2021-11-08 ENCOUNTER — Inpatient Hospital Stay (HOSPITAL_COMMUNITY): Payer: BC Managed Care – PPO

## 2021-11-08 ENCOUNTER — Encounter (HOSPITAL_COMMUNITY): Payer: Self-pay | Admitting: Obstetrics and Gynecology

## 2021-11-08 DIAGNOSIS — O10919 Unspecified pre-existing hypertension complicating pregnancy, unspecified trimester: Principal | ICD-10-CM | POA: Diagnosis present

## 2021-11-08 LAB — TYPE AND SCREEN
ABO/RH(D): B NEG
Antibody Screen: POSITIVE

## 2021-11-08 LAB — CBC
HCT: 32.8 % — ABNORMAL LOW (ref 36.0–46.0)
HCT: 32.2 % — ABNORMAL LOW (ref 36.0–46.0)
Hemoglobin: 11.1 g/dL — ABNORMAL LOW (ref 12.0–15.0)
Hemoglobin: 11.2 g/dL — ABNORMAL LOW (ref 12.0–15.0)
MCH: 33.8 pg (ref 26.0–34.0)
MCH: 34.4 pg — ABNORMAL HIGH (ref 26.0–34.0)
MCHC: 33.8 g/dL (ref 30.0–36.0)
MCHC: 34.8 g/dL (ref 30.0–36.0)
MCV: 100 fL (ref 80.0–100.0)
MCV: 98.8 fL (ref 80.0–100.0)
Platelets: 175 10*3/uL (ref 150–400)
Platelets: 156 10*3/uL (ref 150–400)
RBC: 3.28 MIL/uL — ABNORMAL LOW (ref 3.87–5.11)
RBC: 3.26 MIL/uL — ABNORMAL LOW (ref 3.87–5.11)
RDW: 14.6 % (ref 11.5–15.5)
RDW: 14.7 % (ref 11.5–15.5)
WBC: 7.7 10*3/uL (ref 4.0–10.5)
WBC: 7.3 10*3/uL (ref 4.0–10.5)
nRBC: 0 % (ref 0.0–0.2)
nRBC: 0 % (ref 0.0–0.2)

## 2021-11-08 LAB — RPR: RPR Ser Ql: NONREACTIVE

## 2021-11-08 MED ORDER — FLEET ENEMA 7-19 GM/118ML RE ENEM: 1.0000 | ENEMA | Freq: Every day | RECTAL | Status: DC | PRN

## 2021-11-08 MED ORDER — OXYCODONE HCL 5 MG PO TABS: 5.0000 mg | ORAL_TABLET | ORAL | Status: DC | PRN

## 2021-11-08 MED ORDER — OXYCODONE-ACETAMINOPHEN 5-325 MG PO TABS: 1.0000 | ORAL_TABLET | ORAL | Status: DC | PRN

## 2021-11-08 MED ORDER — SENNOSIDES-DOCUSATE SODIUM 8.6-50 MG PO TABS
2.0000 | ORAL_TABLET | Freq: Every day | ORAL | Status: DC
  Administered 2021-11-09: 2 via ORAL
  Filled 2021-11-08: qty 2

## 2021-11-08 MED ORDER — EPHEDRINE 5 MG/ML INJ: 10.0000 mg | INTRAVENOUS | Status: DC | PRN

## 2021-11-08 MED ORDER — ONDANSETRON HCL 4 MG/2ML IJ SOLN: 4.0000 mg | INTRAMUSCULAR | Status: DC | PRN

## 2021-11-08 MED ORDER — PHENYLEPHRINE 80 MCG/ML (10ML) SYRINGE FOR IV PUSH (FOR BLOOD PRESSURE SUPPORT)
80.0000 ug | PREFILLED_SYRINGE | INTRAVENOUS | Status: DC | PRN
  Filled 2021-11-08: qty 10

## 2021-11-08 MED ORDER — ACETAMINOPHEN 325 MG PO TABS: 650.0000 mg | ORAL_TABLET | ORAL | Status: DC | PRN

## 2021-11-08 MED ORDER — MEDROXYPROGESTERONE ACETATE 150 MG/ML IM SUSP: 150.0000 mg | INTRAMUSCULAR | Status: DC | PRN

## 2021-11-08 MED ORDER — OXYTOCIN BOLUS FROM INFUSION
333.0000 mL | Freq: Once | INTRAVENOUS | Status: AC
  Administered 2021-11-08: 333 mL via INTRAVENOUS

## 2021-11-08 MED ORDER — LIDOCAINE HCL (PF) 1 % IJ SOLN: 30.0000 mL | INTRAMUSCULAR | Status: DC | PRN

## 2021-11-08 MED ORDER — LACTATED RINGERS IV SOLN: 500.0000 mL | INTRAVENOUS | Status: DC | PRN

## 2021-11-08 MED ORDER — OXYTOCIN-SODIUM CHLORIDE 30-0.9 UT/500ML-% IV SOLN: 2.5000 [IU]/h | INTRAVENOUS | Status: DC

## 2021-11-08 MED ORDER — COCONUT OIL OIL: 1.0000 | TOPICAL_OIL | Status: DC | PRN

## 2021-11-08 MED ORDER — LABETALOL HCL 100 MG PO TABS
100.0000 mg | ORAL_TABLET | Freq: Two times a day (BID) | ORAL | Status: DC
  Administered 2021-11-08 (×2): 100 mg via ORAL
  Filled 2021-11-08 (×2): qty 1

## 2021-11-08 MED ORDER — WITCH HAZEL-GLYCERIN EX PADS: 1.0000 | MEDICATED_PAD | CUTANEOUS | Status: DC | PRN

## 2021-11-08 MED ORDER — IBUPROFEN 600 MG PO TABS
600.0000 mg | ORAL_TABLET | Freq: Four times a day (QID) | ORAL | Status: DC
  Administered 2021-11-08 – 2021-11-09 (×5): 600 mg via ORAL
  Filled 2021-11-08 (×5): qty 1

## 2021-11-08 MED ORDER — DIPHENHYDRAMINE HCL 50 MG/ML IJ SOLN: 12.5000 mg | INTRAMUSCULAR | Status: DC | PRN

## 2021-11-08 MED ORDER — LACTATED RINGERS IV SOLN: 500.0000 mL | Freq: Once | INTRAVENOUS | Status: DC

## 2021-11-08 MED ORDER — SIMETHICONE 80 MG PO CHEW: 80.0000 mg | CHEWABLE_TABLET | ORAL | Status: DC | PRN

## 2021-11-08 MED ORDER — ONDANSETRON HCL 4 MG PO TABS: 4.0000 mg | ORAL_TABLET | ORAL | Status: DC | PRN

## 2021-11-08 MED ORDER — DIBUCAINE (PERIANAL) 1 % EX OINT: 1.0000 | TOPICAL_OINTMENT | CUTANEOUS | Status: DC | PRN

## 2021-11-08 MED ORDER — MEASLES, MUMPS & RUBELLA VAC IJ SOLR: 0.5000 mL | Freq: Once | INTRAMUSCULAR | Status: DC

## 2021-11-08 MED ORDER — OXYTOCIN-SODIUM CHLORIDE 30-0.9 UT/500ML-% IV SOLN
1.0000 m[IU]/min | INTRAVENOUS | Status: DC
  Administered 2021-11-08: 2 m[IU]/min via INTRAVENOUS
  Filled 2021-11-08: qty 500

## 2021-11-08 MED ORDER — ONDANSETRON HCL 4 MG/2ML IJ SOLN: 4.0000 mg | Freq: Four times a day (QID) | INTRAMUSCULAR | Status: DC | PRN

## 2021-11-08 MED ORDER — OXYCODONE-ACETAMINOPHEN 5-325 MG PO TABS: 2.0000 | ORAL_TABLET | ORAL | Status: DC | PRN

## 2021-11-08 MED ORDER — LACTATED RINGERS IV SOLN: INTRAVENOUS | Status: DC

## 2021-11-08 MED ORDER — TERBUTALINE SULFATE 1 MG/ML IJ SOLN: 0.2500 mg | Freq: Once | INTRAMUSCULAR | Status: DC | PRN

## 2021-11-08 MED ORDER — BISACODYL 10 MG RE SUPP: 10.0000 mg | Freq: Every day | RECTAL | Status: DC | PRN

## 2021-11-08 MED ORDER — PHENYLEPHRINE 80 MCG/ML (10ML) SYRINGE FOR IV PUSH (FOR BLOOD PRESSURE SUPPORT): 80.0000 ug | PREFILLED_SYRINGE | INTRAVENOUS | Status: DC | PRN

## 2021-11-08 MED ORDER — ZOLPIDEM TARTRATE 5 MG PO TABS: 5.0000 mg | ORAL_TABLET | Freq: Every evening | ORAL | Status: DC | PRN

## 2021-11-08 MED ORDER — MISOPROSTOL 25 MCG QUARTER TABLET
25.0000 ug | ORAL_TABLET | Freq: Once | ORAL | Status: AC
  Administered 2021-11-08: 25 ug via ORAL
  Filled 2021-11-08: qty 1

## 2021-11-08 MED ORDER — SOD CITRATE-CITRIC ACID 500-334 MG/5ML PO SOLN: 30.0000 mL | ORAL | Status: DC | PRN

## 2021-11-08 MED ORDER — PRENATAL MULTIVITAMIN CH
1.0000 | ORAL_TABLET | Freq: Every day | ORAL | Status: DC
  Administered 2021-11-08 – 2021-11-09 (×2): 1 via ORAL
  Filled 2021-11-08 (×2): qty 1

## 2021-11-08 MED ORDER — OXYCODONE HCL 5 MG PO TABS: 10.0000 mg | ORAL_TABLET | ORAL | Status: DC | PRN

## 2021-11-08 MED ORDER — TETANUS-DIPHTH-ACELL PERTUSSIS 5-2.5-18.5 LF-MCG/0.5 IM SUSY: 0.5000 mL | PREFILLED_SYRINGE | Freq: Once | INTRAMUSCULAR | Status: DC

## 2021-11-08 MED ORDER — FENTANYL-BUPIVACAINE-NACL 0.5-0.125-0.9 MG/250ML-% EP SOLN
12.0000 mL/h | EPIDURAL | Status: DC | PRN
  Filled 2021-11-08: qty 250

## 2021-11-08 MED ORDER — BENZOCAINE-MENTHOL 20-0.5 % EX AERO: 1.0000 | INHALATION_SPRAY | CUTANEOUS | Status: DC | PRN

## 2021-11-08 MED ORDER — DIPHENHYDRAMINE HCL 25 MG PO CAPS: 25.0000 mg | ORAL_CAPSULE | Freq: Four times a day (QID) | ORAL | Status: DC | PRN

## 2021-11-08 MED ORDER — MISOPROSTOL 50MCG HALF TABLET
50.0000 ug | ORAL_TABLET | Freq: Once | ORAL | Status: AC
  Administered 2021-11-08: 50 ug via VAGINAL
  Filled 2021-11-08: qty 1

## 2021-11-08 NOTE — Lactation Note (Signed)
This note was copied from a baby's chart. Lactation Consultation Note  Patient Name: Girl Tayja Manzer [id number]'Z Date: 11/08/2021   Age:41 hours  LC called RN to see if the birth parent wanted to see lactation. Per the RN, the birth parent is about to go upstairs and would like to be seen by lactation when she gets to the [facility].   Maternal Data    Feeding    LATCH Score                    Lactation Tools Discussed/Used    Interventions    Discharge    Consult Status      Lysbeth Penner 11/08/2021, 10:21 AM

## 2021-11-08 NOTE — Lactation Note (Signed)
This note was copied from a baby's chart. Lactation Consultation Note  Patient Name: Lynn Barnett [id number] Date: 11/08/2021 Reason for consult: Initial assessment;Early term 37-38.6wks Age:41 hours, ETI female infant, P3 Infant had one void diaper since birth. Per Birth Parent infant is latching well she doesn't have any BF concerns at this time. Birth Parent is experienced with breastfeeding see maternal data below. Birth Parent is latching infant on both breast during a feeding, infant has been BF for 20 minutes most feedings.  Birth Parent will continue to breastfeed infant by hunger cues, on demand, 8 to 12+ times within 24 hours, STS. Birth Parent knows to call [facility] for latch assistance if needed. LC discussed importance of maternal rest, diet and hydration with Birth Parent. Mom made aware of O/P services, breastfeeding support groups, community resources, and our phone # for post-discharge questions.    Maternal Data Has patient been taught Hand Expression?: Yes Does the patient have breastfeeding experience prior to this delivery?: Yes How long did the patient breastfeed?: Per Birth Parent, she BF 1st child for 6 months and 2nd child for 14 months  Feeding Mother's Current Feeding Choice: Breast Milk and Formula  LATCH Score                    Lactation Tools Discussed/Used    Interventions Interventions: Breast feeding basics reviewed;Assisted with latch;Skin to skin;Education;LC Services brochure  Discharge Pump: Personal (Spectra DEBP)  Consult Status Consult Status: Follow-up Date: 11/09/21 Follow-up type: In-patient    Vicente Serene 11/08/2021, 6:18 PM

## 2021-11-09 LAB — CBC
HCT: 29.9 % — ABNORMAL LOW (ref 36.0–46.0)
Hemoglobin: 10.4 g/dL — ABNORMAL LOW (ref 12.0–15.0)
MCH: 34.4 pg — ABNORMAL HIGH (ref 26.0–34.0)
MCHC: 34.8 g/dL (ref 30.0–36.0)
MCV: 99 fL (ref 80.0–100.0)
Platelets: 149 10*3/uL — ABNORMAL LOW (ref 150–400)
RBC: 3.02 MIL/uL — ABNORMAL LOW (ref 3.87–5.11)
RDW: 14.8 % (ref 11.5–15.5)
WBC: 8.7 10*3/uL (ref 4.0–10.5)
nRBC: 0 % (ref 0.0–0.2)

## 2021-11-09 MED ORDER — IBUPROFEN 600 MG PO TABS: 600.0000 mg | ORAL_TABLET | Freq: Four times a day (QID) | ORAL | 0 refills | Status: AC

## 2021-11-09 MED ORDER — ACETAMINOPHEN 325 MG PO TABS: 650.0000 mg | ORAL_TABLET | ORAL | 0 refills | Status: AC | PRN

## 2021-11-09 NOTE — Discharge Summary (Signed)
Obstetric Discharge Summary  Lynn Barnett is a 41 y.o. female that presented on 11/07/2021 for induction of labor for chronic hypertension on labetalol.  She was admitted to labor and delivery for her induction.  Her labor course was uncomplicated and she delivered a viable female infant on 11/08/2021.  Her postpartum course was uncomplicated and on PPD#1, she reported well controlled pain, spontaneous voiding, ambulating without difficulty, and tolerating PO.  She was stable for discharge home on 11/09/2021 with plans for in-office follow up.  Hemoglobin  Date Value Ref Range Status  11/09/2021 10.4 (L) 12.0 - 15.0 g/dL Final   HCT  Date Value Ref Range Status  11/09/2021 29.9 (L) 36.0 - 46.0 % Final    Physical Exam:  General: alert and no distress Lochia: appropriate Uterine Fundus: firm DVT Evaluation: No evidence of DVT seen on physical exam.  Discharge Diagnoses: Term Pregnancy-delivered  Discharge Information: Date: 11/09/2021 Activity: Pelvic rest, as tolerated Diet: routine Medications: Tylenol, motrin Condition: stable Instructions: Refer to practice specific booklet.  Discussed prior to discharge.  Discharge to: [place] Follow up.   Why: Please follow up for a 1 week blood pressure check and a 6 week postpartum visit. Contact information: [address] [place] [address] [phone]                 Newborn Data: Live born female  Birth Weight: 7 lb 9.7 oz (3450 g) APGAR: 8, 9  Newborn Delivery   Birth date/time: 11/08/2021 09:11:00 Delivery type: Vaginal, Spontaneous      Home with mother.  Carlyon Shadow 11/09/2021, 9:54 PM

## 2021-11-09 NOTE — Progress Notes (Signed)
Postpartum Progress Note  Post Partum Day 1 s/p spontaneous vaginal delivery.  Patient reports well-controlled pain, ambulating without difficulty, voiding spontaneously, tolerating PO.  Vaginal bleeding is appropriate.   Objective: Blood pressure 123/81, pulse 66, temperature 98 F (36.7 C), resp. rate 18, height 5\' 3"  (1.6 m), weight 84.1 kg, SpO2 97 %, unknown if currently breastfeeding.  Physical Exam:  General: alert Lochia: appropriate Uterine Fundus: firm DVT Evaluation: No evidence of DVT seen on physical exam.  Recent Labs    11/08/21 0841 11/09/21 0547  HGB 11.2* 10.4*  HCT 32.2* 29.9*    Assessment/Plan: Postpartum Day 1, s/p vaginal delivery. Continue routine postpartum care Lactation following Baby girl cHTN on labetalol- BP well controlled. Will continue, return to office for 1 week BP check Anticipate discharge home today   LOS: 2 days   Lynn Barnett 11/09/2021, 7:52 AM

## 2021-11-09 NOTE — Lactation Note (Signed)
This note was copied from a baby's chart. Lactation Consultation Note  Patient Name: Lynn Barnett [id number] Date: 11/09/2021 Reason for consult: Initial assessment Age:41 hours  P3, Family denies questions or concerns. Baby has been cluster feeding. Suggest calling for help as needed.  Maternal Data Has patient been taught Hand Expression?: Yes Does the patient have breastfeeding experience prior to this delivery?: Yes  Feeding Mother's Current Feeding Choice: Breast Milk   Interventions Interventions: Education;LC Services brochure  Consult Status Consult Status: Follow-up Date: 11/10/21 Follow-up type: In-patient    Vivianne Master [facility] 11/09/2021, 1:44 PM

## 2021-11-09 NOTE — Progress Notes (Signed)
Called Dr Lowella Dell re: labetol dose currently due.  Current BP is 107/66.  Held 1000 dose per Dr.  Edmonia Lynch BP later in day prior to discharge.

## 2021-11-21 ENCOUNTER — Telehealth (HOSPITAL_COMMUNITY): Payer: Self-pay

## 2021-11-21 NOTE — Telephone Encounter (Signed)
Patient did not answer phone call. Voicemail left for patient.   Sharyn Lull [facility] 11/21/2021,1812
